# Patient Record
Sex: Female | Born: 1955 | Race: White | Hispanic: No | Marital: Married | Smoking: Never smoker
Health system: Southern US, Community
[De-identification: ages and names within clinical notes are randomized; demographics above are authoritative.]

## PROBLEM LIST (undated history)

## (undated) DIAGNOSIS — G35 Multiple sclerosis: Secondary | ICD-10-CM

## (undated) DIAGNOSIS — H539 Unspecified visual disturbance: Secondary | ICD-10-CM

## (undated) DIAGNOSIS — G629 Polyneuropathy, unspecified: Secondary | ICD-10-CM

## (undated) DIAGNOSIS — R51 Headache: Secondary | ICD-10-CM

## (undated) DIAGNOSIS — I10 Essential (primary) hypertension: Secondary | ICD-10-CM

## (undated) DIAGNOSIS — G259 Extrapyramidal and movement disorder, unspecified: Secondary | ICD-10-CM

## (undated) DIAGNOSIS — R519 Headache, unspecified: Secondary | ICD-10-CM

## (undated) HISTORY — DX: Headache: R51

## (undated) HISTORY — DX: Essential (primary) hypertension: I10

## (undated) HISTORY — DX: Extrapyramidal and movement disorder, unspecified: G25.9

## (undated) HISTORY — DX: Multiple sclerosis: G35

## (undated) HISTORY — DX: Headache, unspecified: R51.9

## (undated) HISTORY — DX: Unspecified visual disturbance: H53.9

## (undated) HISTORY — DX: Polyneuropathy, unspecified: G62.9

---

## 1998-05-31 ENCOUNTER — Other Ambulatory Visit: Admission: RE | Admit: 1998-05-31 | Discharge: 1998-05-31 | Payer: Self-pay | Admitting: *Deleted

## 1998-06-16 ENCOUNTER — Ambulatory Visit (HOSPITAL_COMMUNITY): Admission: RE | Admit: 1998-06-16 | Discharge: 1998-06-16 | Payer: Self-pay | Admitting: *Deleted

## 1998-07-21 ENCOUNTER — Ambulatory Visit (HOSPITAL_COMMUNITY): Admission: RE | Admit: 1998-07-21 | Discharge: 1998-07-21 | Payer: Self-pay | Admitting: Gastroenterology

## 1999-01-20 ENCOUNTER — Ambulatory Visit (HOSPITAL_COMMUNITY): Admission: RE | Admit: 1999-01-20 | Discharge: 1999-01-20 | Payer: Self-pay | Admitting: *Deleted

## 1999-06-28 ENCOUNTER — Other Ambulatory Visit: Admission: RE | Admit: 1999-06-28 | Discharge: 1999-06-28 | Payer: Self-pay | Admitting: *Deleted

## 1999-07-08 ENCOUNTER — Ambulatory Visit (HOSPITAL_COMMUNITY): Admission: RE | Admit: 1999-07-08 | Discharge: 1999-07-08 | Payer: Self-pay | Admitting: *Deleted

## 2002-02-25 ENCOUNTER — Other Ambulatory Visit: Admission: RE | Admit: 2002-02-25 | Discharge: 2002-02-25 | Payer: Self-pay | Admitting: Obstetrics and Gynecology

## 2002-03-06 ENCOUNTER — Ambulatory Visit (HOSPITAL_COMMUNITY): Admission: RE | Admit: 2002-03-06 | Discharge: 2002-03-06 | Payer: Self-pay | Admitting: Obstetrics and Gynecology

## 2002-03-06 ENCOUNTER — Encounter: Payer: Self-pay | Admitting: Obstetrics and Gynecology

## 2002-03-15 ENCOUNTER — Encounter: Payer: Self-pay | Admitting: Neurology

## 2002-03-15 ENCOUNTER — Ambulatory Visit (HOSPITAL_COMMUNITY): Admission: RE | Admit: 2002-03-15 | Discharge: 2002-03-15 | Payer: Self-pay | Admitting: Neurology

## 2002-04-12 ENCOUNTER — Encounter: Payer: Self-pay | Admitting: Emergency Medicine

## 2002-04-12 ENCOUNTER — Emergency Department (HOSPITAL_COMMUNITY): Admission: EM | Admit: 2002-04-12 | Discharge: 2002-04-12 | Payer: Self-pay | Admitting: Emergency Medicine

## 2004-03-24 ENCOUNTER — Other Ambulatory Visit: Admission: RE | Admit: 2004-03-24 | Discharge: 2004-03-24 | Payer: Self-pay | Admitting: Family Medicine

## 2004-04-21 ENCOUNTER — Ambulatory Visit (HOSPITAL_COMMUNITY): Admission: RE | Admit: 2004-04-21 | Discharge: 2004-04-21 | Payer: Self-pay | Admitting: Family Medicine

## 2006-04-19 ENCOUNTER — Other Ambulatory Visit: Admission: RE | Admit: 2006-04-19 | Discharge: 2006-04-19 | Payer: Self-pay | Admitting: Family Medicine

## 2006-05-03 ENCOUNTER — Ambulatory Visit (HOSPITAL_COMMUNITY): Admission: RE | Admit: 2006-05-03 | Discharge: 2006-05-03 | Payer: Self-pay | Admitting: Family Medicine

## 2007-05-05 ENCOUNTER — Other Ambulatory Visit: Admission: RE | Admit: 2007-05-05 | Discharge: 2007-05-05 | Payer: Self-pay | Admitting: Family Medicine

## 2007-05-23 ENCOUNTER — Ambulatory Visit (HOSPITAL_COMMUNITY): Admission: RE | Admit: 2007-05-23 | Discharge: 2007-05-23 | Payer: Self-pay | Admitting: Family Medicine

## 2008-05-14 ENCOUNTER — Other Ambulatory Visit: Admission: RE | Admit: 2008-05-14 | Discharge: 2008-05-14 | Payer: Self-pay | Admitting: Family Medicine

## 2008-06-04 ENCOUNTER — Ambulatory Visit (HOSPITAL_COMMUNITY): Admission: RE | Admit: 2008-06-04 | Discharge: 2008-06-04 | Payer: Self-pay | Admitting: Family Medicine

## 2008-08-20 ENCOUNTER — Encounter: Admission: RE | Admit: 2008-08-20 | Discharge: 2008-10-20 | Payer: Self-pay | Admitting: Neurology

## 2009-12-16 ENCOUNTER — Ambulatory Visit (HOSPITAL_COMMUNITY): Admission: RE | Admit: 2009-12-16 | Discharge: 2009-12-16 | Payer: Self-pay | Admitting: Family Medicine

## 2010-09-22 ENCOUNTER — Other Ambulatory Visit: Admission: RE | Admit: 2010-09-22 | Discharge: 2010-09-22 | Payer: Self-pay | Admitting: *Deleted

## 2010-12-22 ENCOUNTER — Ambulatory Visit (HOSPITAL_COMMUNITY)
Admission: RE | Admit: 2010-12-22 | Discharge: 2010-12-22 | Payer: Self-pay | Source: Home / Self Care | Attending: Family Medicine | Admitting: Family Medicine

## 2012-02-20 ENCOUNTER — Other Ambulatory Visit (HOSPITAL_COMMUNITY): Payer: Self-pay | Admitting: Family Medicine

## 2012-02-20 DIAGNOSIS — Z1231 Encounter for screening mammogram for malignant neoplasm of breast: Secondary | ICD-10-CM

## 2012-02-22 ENCOUNTER — Telehealth: Payer: Self-pay

## 2012-02-22 NOTE — Telephone Encounter (Signed)
Pt will be out of meds today it was faxed over

## 2012-02-23 NOTE — Telephone Encounter (Signed)
LMOM to CB. I could not find a record of any OVs here since Epic, and couldn't find pt in Med Man either by name or DOB

## 2012-02-24 NOTE — Telephone Encounter (Signed)
LMOM TO CB 

## 2012-02-25 NOTE — Telephone Encounter (Signed)
PT CALLED AND STATED THAT SHE HAS NOT CALLED [place] AT ALL REGARDING MEDICATIONS, SO IM GUESSING THE MESSAGE WAS PUT IN THE WRONG CHART.

## 2012-02-25 NOTE — Telephone Encounter (Signed)
LMOM TO CB 

## 2012-03-14 ENCOUNTER — Ambulatory Visit (HOSPITAL_COMMUNITY)
Admission: RE | Admit: 2012-03-14 | Discharge: 2012-03-14 | Disposition: A | Discharge status: Home / Self Care | Payer: BC Managed Care – PPO | Source: Ambulatory Visit | Attending: Family Medicine | Admitting: Family Medicine

## 2012-03-14 DIAGNOSIS — Z1231 Encounter for screening mammogram for malignant neoplasm of breast: Secondary | ICD-10-CM | POA: Insufficient documentation

## 2013-11-13 ENCOUNTER — Other Ambulatory Visit (HOSPITAL_COMMUNITY)
Admission: RE | Admit: 2013-11-13 | Discharge: 2013-11-13 | Disposition: A | Discharge status: Home / Self Care | Payer: BC Managed Care – PPO | Source: Ambulatory Visit | Attending: Family Medicine | Admitting: Family Medicine

## 2013-11-13 ENCOUNTER — Other Ambulatory Visit: Payer: Self-pay | Admitting: Family Medicine

## 2013-11-13 DIAGNOSIS — Z0000 Encounter for general adult medical examination without abnormal findings: Secondary | ICD-10-CM | POA: Insufficient documentation

## 2015-03-04 ENCOUNTER — Telehealth: Payer: Self-pay | Admitting: *Deleted

## 2015-03-04 ENCOUNTER — Other Ambulatory Visit: Payer: Self-pay | Admitting: Neurology

## 2015-03-04 NOTE — Telephone Encounter (Signed)
Patient has not been seen at [facility] 

## 2015-03-04 NOTE — Telephone Encounter (Signed)
Patient calling for medication refill, informed patient that she would have to be seen in the office before medicine can be refilled, patient was scheduled for 4/1, states that she will call back if unable to keep appointment

## 2015-03-04 NOTE — Telephone Encounter (Signed)
Noted/fim 

## 2015-03-05 ENCOUNTER — Encounter: Payer: Self-pay | Admitting: Neurology

## 2015-03-05 ENCOUNTER — Ambulatory Visit (INDEPENDENT_AMBULATORY_CARE_PROVIDER_SITE_OTHER): Payer: BC Managed Care – PPO | Admitting: Neurology

## 2015-03-05 VITALS — BP 130/82 | HR 64 | Resp 14 | Ht 65.0 in | Wt 156.0 lb

## 2015-03-05 DIAGNOSIS — R208 Other disturbances of skin sensation: Secondary | ICD-10-CM | POA: Diagnosis not present

## 2015-03-05 DIAGNOSIS — F329 Major depressive disorder, single episode, unspecified: Secondary | ICD-10-CM | POA: Insufficient documentation

## 2015-03-05 DIAGNOSIS — R350 Frequency of micturition: Secondary | ICD-10-CM | POA: Diagnosis not present

## 2015-03-05 DIAGNOSIS — R269 Unspecified abnormalities of gait and mobility: Secondary | ICD-10-CM | POA: Insufficient documentation

## 2015-03-05 DIAGNOSIS — R5383 Other fatigue: Secondary | ICD-10-CM

## 2015-03-05 DIAGNOSIS — G35 Multiple sclerosis: Secondary | ICD-10-CM | POA: Diagnosis not present

## 2015-03-05 DIAGNOSIS — F32A Depression, unspecified: Secondary | ICD-10-CM | POA: Insufficient documentation

## 2015-03-05 DIAGNOSIS — Z79899 Other long term (current) drug therapy: Secondary | ICD-10-CM | POA: Insufficient documentation

## 2015-03-05 MED ORDER — AMPYRA 10 MG PO TB12: 10.0000 mg | ORAL_TABLET | Freq: Two times a day (BID) | ORAL | Status: DC

## 2015-03-05 MED ORDER — GILENYA 0.5 MG PO CAPS: ORAL_CAPSULE | ORAL | Status: DC

## 2015-03-05 MED ORDER — SOLIFENACIN SUCCINATE 10 MG PO TABS: 10.0000 mg | ORAL_TABLET | Freq: Every day | ORAL | Status: DC

## 2015-03-05 MED ORDER — BACLOFEN 10 MG PO TABS: 10.0000 mg | ORAL_TABLET | Freq: Three times a day (TID) | ORAL | Status: DC

## 2015-03-05 MED ORDER — GABAPENTIN 300 MG PO CAPS: ORAL_CAPSULE | ORAL | Status: DC

## 2015-03-05 NOTE — Progress Notes (Signed)
[facility]  PATIENT: Denise Bridges DOB: 08-15-1956  REFERRING DOCTOR OR PCP:  Cammie Fulp SOURCE: Patient  _________________________________   HISTORICAL  CHIEF COMPLAINT:  Chief Complaint  Patient presents with  . Multiple Sclerosis    Sts. she tolerates Gilenya well--is taking it qod due to abnormal labs.  She needs r/f of Gabapentin./fim    HISTORY OF PRESENT ILLNESS:  Denise Bridges is a 59 year old woman with multiple sclerosis.    She presented with facial weakness in the early 1990s. An MRI was consistent with MS. A lumbar puncture was not needed. She was started, initially, on Avonex. About 4 years ago, she switch to Gilenya. She tolerates Gilenya well and has not had any exacerbation. However, she has had progressive worsening of her gait over the past 10 years or so.  Gait/strength/sensation: She has had difficulty with her gait. The left leg is weaker than the right. There is clumsiness as well.  She denies any significant numbness in the legs. However, she does have numbness and tingling in the left arm. She has been on Ampyra for a couple years with some benefit of her gait. Gabapentin helps the tingling some.  Bladder: She has urinary frequency and urgency. She has had some incontinence, often on her way to the bathroom. She has nocturia twice most nights. As not noted much of difference on Vesicare.  Vision: Prior to her diagnosis of MS, she had an episode of diplopia that eventually improved. Currently, she denies any MS related visual problems. She sees her eye doctor once a year.  Fatigue/sleep: She does note physical more than cognitive fatigue. This is often worse later in the day. She has more difficulty walking longer distances because of fatigue. She has difficulty falling asleep. Once asleep she will usually sleep well. However, nocturia can wake her up several times a night.  She notes that restless leg syndrome. Gabapentin and baclofen have  just helped a little bit.  This keeps her up some nights.  Mood/cognition: She is on venlafaxine which has helped her mild mood issues. She does note cognitive fog and has made more errors at work. She notes specifically that she has difficulty coming up with the right words and that she often transposes numbers.  There is some apathy. Processing speed is also slower. She notes some difficulty with short-term memory as well.  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  REVIEW OF SYSTEMS: Constitutional: No fevers, chills, sweats, or change in appetite.  She notes fatigue Eyes: No visual changes, double vision, eye pain Ear, nose and throat: No hearing loss, ear pain, nasal congestion, sore throat Cardiovascular: No chest pain, palpitations Respiratory: No shortness of breath at rest or with exertion.   No wheezes GastrointestinaI: No nausea, vomiting, diarrhea, abdominal pain, fecal incontinence Genitourinary: As above. Musculoskeletal: No neck pain, back pain Integumentary: No rash, pruritus, skin lesions Neurological: as above Psychiatric: No depression at this time.  No anxiety Endocrine: No palpitations, diaphoresis, change in appetite, change in weigh or increased thirst Hematologic/Lymphatic: No anemia, purpura, petechiae. Allergic/Immunologic: No itchy/runny eyes, nasal congestion, recent allergic reactions, rashes  ALLERGIES: Allergies  Allergen Reactions  . Losartan Cough    HOME MEDICATIONS:  Current outpatient prescriptions:  .  amLODipine (NORVASC) 5 MG tablet, , Disp: , Rfl:  .  AMPYRA 10 MG TB12, Take 10 mg by mouth 2 (two) times daily., Disp: , Rfl:  .  atenolol (TENORMIN) 100 MG tablet,  Take 100 mg by mouth daily., Disp: , Rfl:  .  cholecalciferol (VITAMIN D) 1000 UNITS tablet, Take 1,000 Units by mouth daily., Disp: , Rfl:  .  gabapentin (NEURONTIN) 300 MG capsule, Take 300 mg by mouth 3 (three) times daily., Disp: , Rfl:  .  GILENYA 0.5 MG CAPS,  Take 0.5 mg by mouth every other day., Disp: , Rfl:   PAST MEDICAL HISTORY: Past Medical History  Diagnosis Date  . Multiple sclerosis   . Movement disorder   . Hypertension   . Headache   . Neuropathy   . Vision abnormalities     PAST SURGICAL HISTORY: History reviewed. No pertinent past surgical history.  FAMILY HISTORY: Family History  Problem Relation Age of Onset  . Pancreatic cancer Mother   . Diabetes Mother   . Prostate cancer Father   . Dementia Father   . Multiple sclerosis Cousin     SOCIAL HISTORY:  History   Social History  . Marital Status: Married    Spouse Name: N/A  . Number of Children: N/A  . Years of Education: N/A   Occupational History  . Not on file.   Social History Main Topics  . Smoking status: Never Smoker   . Smokeless tobacco: Not on file  . Alcohol Use: No  . Drug Use: No  . Sexual Activity: Not on file   Other Topics Concern  . Not on file   Social History Narrative  . No narrative on file     PHYSICAL EXAM  Filed Vitals:   03/05/15 0846  BP: 130/82  Pulse: 64  Resp: 14  Height: 5\' 5"  (1.651 m)  Weight: 156 lb (70.761 kg)    Body mass index is 25.96 kg/(m^2).   General: The patient is well-developed and well-nourished and in no acute distress  Eyes:  Funduscopic exam shows normal optic discs and retinal vessels.  Neck: The neck is supple, no carotid bruits are noted.  The neck is nontender.  Cardiovascular: The heart has a regular rate and rhythm with a normal S1 and S2. There were no murmurs, gallops or rubs. Lungs are clear to auscultation.  Skin: Extremities are without significant edema.  Musculoskeletal:  Back is nontender  Neurologic Exam  Mental status: The patient is alert and oriented x 3 at the time of the examination. The patient has apparent normal recent and remote memory, with an apparently normal attention span and concentration ability.   Speech is normal.  Cranial nerves: Extraocular  movements are full. Pupils are equal, round, and reactive to light and accomodation.  Visual fields are full.  Facial symmetry is present. There is good facial sensation to soft touch bilaterally.Facial strength is normal.  Trapezius and sternocleidomastoid strength is normal. No dysarthria is noted.  The tongue is midline, and the patient has symmetric elevation of the soft palate. No obvious hearing deficits are noted.  Motor:  Muscle bulk is normal.   Tone is increased in both legs, more the left. Strength is  5 / 5 in all 4 extremities.   Sensory: Sensory testing shows slight decreased touch sensation in left arm and leg..  Coordination: Cerebellar testing reveals good finger-nose-finger and reduced heel-to-shin, worse on left.  Gait and station: Station is normal.   Gait is mildly wide and spastic, especially with the left leg. Her turn was off balance. She cannot do a good tandem walk.. Romberg is negative.   Reflexes: Deep tendon reflexes are increased in legs, left  more than right. There was nonsustained clonus at the left ankle and spread from the left knee. Reflexes in the arms were more symmetric.   Plantar responses are extensor on the left.    DIAGNOSTIC DATA (LABS, IMAGING, TESTING) - I reviewed patient records, labs, notes, testing and imaging myself where available.    ASSESSMENT AND PLAN  Multiple sclerosis - Plan: CBC with Differential  Gait disorder  Dysesthesia  High risk medication use - Plan: CBC with Differential  Urinary frequency  Other fatigue  Depression   In summary, Shailey Butterbaugh is a 59 year old woman with multiple sclerosis who is fairly stable on Gilenya therapy. Although she has not had any exacerbations she has had some progression of gait and cognitive disturbances. She will continue the Ampyra for her gait.   I will try to get her last MRI done late 2015 to make sure that there are not any concerns about subclinical progression. Part of her  cognitive issues could be worsening sleep have made some changes in education, including increasing her gabapentin to try to help.  Bladder dysfunction is often a problem and we will double her Vesicare to 10 mg.  She is going to also retry baclofen to see if it will help her spastic gait she'll start at 10 mg by mouth 3 times a day. We discussed that if she is sleeping of the baclofen that she might benefit from entering a drug study with there is a time release baclofen.  She will return to see me in 4 months or sooner if she has new or worsening neurologic symptoms.  45 minute face-to-face evaluation with greater than 30% of the time counseling and coordinating care about her multiple sclerosis and related symptoms. bacl (trial?) Inc gab  Anthonella Klausner A. Epimenio Foot, MD, PhD 03/05/2015, 9:06 AM Certified in Neurology, Clinical Neurophysiology, Sleep Medicine, Pain Medicine and Neuroimaging  [facility] [address] [place], [place] [address] [phone]

## 2015-03-06 LAB — CBC WITH DIFFERENTIAL/PLATELET
BASOS ABS: 0 10*3/uL (ref 0.0–0.2)
Basos: 0 %
Eos: 1 %
Eosinophils Absolute: 0 10*3/uL (ref 0.0–0.4)
HCT: 36.6 % (ref 34.0–46.6)
Hemoglobin: 11.9 g/dL (ref 11.1–15.9)
IMMATURE GRANULOCYTES: 0 %
Immature Grans (Abs): 0 10*3/uL (ref 0.0–0.1)
LYMPHS ABS: 0.4 10*3/uL — AB (ref 0.7–3.1)
Lymphs: 10 %
MCH: 30.3 pg (ref 26.6–33.0)
MCHC: 32.5 g/dL (ref 31.5–35.7)
MCV: 93 fL (ref 79–97)
MONOCYTES: 17 %
MONOS ABS: 0.6 10*3/uL (ref 0.1–0.9)
NEUTROS ABS: 2.7 10*3/uL (ref 1.4–7.0)
NEUTROS PCT: 72 %
Platelets: 200 10*3/uL (ref 150–379)
RBC: 3.93 x10E6/uL (ref 3.77–5.28)
RDW: 13.9 % (ref 12.3–15.4)
WBC: 3.7 10*3/uL (ref 3.4–10.8)

## 2015-03-08 ENCOUNTER — Telehealth: Payer: Self-pay | Admitting: *Deleted

## 2015-03-08 NOTE — Telephone Encounter (Signed)
LMTC./fim 

## 2015-03-08 NOTE — Telephone Encounter (Signed)
-----   Message from Richard A Sater, MD sent at 03/06/2015  8:57 AM EDT ----- Blood count is ok.   (low lymphs expected on Gilenya) 

## 2015-03-12 NOTE — Telephone Encounter (Signed)
-----   Message from Asa Lente, MD sent at 03/06/2015  8:57 AM EDT ----- Blood count is ok.   (low lymphs expected on Gilenya)

## 2015-03-12 NOTE — Telephone Encounter (Signed)
LMOM that per RAS labwork is ok--no med changes needed.  She only needs to return this call if she has questions/concerns/fim

## 2015-03-25 ENCOUNTER — Other Ambulatory Visit (HOSPITAL_COMMUNITY): Payer: Self-pay | Admitting: Family Medicine

## 2015-03-25 DIAGNOSIS — Z1231 Encounter for screening mammogram for malignant neoplasm of breast: Secondary | ICD-10-CM

## 2015-03-31 ENCOUNTER — Telehealth: Payer: Self-pay | Admitting: Neurology

## 2015-03-31 NOTE — Telephone Encounter (Signed)
Ins has been provided with clinical info, request is currently under review.  Ref Key: Sammuel Cooper.  I called back to advise.  They are aware.

## 2015-03-31 NOTE — Telephone Encounter (Signed)
Denise Bridges, pt's husband called wanting to see what needs to be done to approve the script for GILENYA 0.5 MG CAPS. He states that he received a letter from [facility] stating that Dr. Epimenio Foot needs to  Do a prior authorization for the script to be approved. Please call and advice # [phone]

## 2015-04-01 NOTE — Telephone Encounter (Signed)
[facility] has approved the request for coverage on Gilenya effective until 03/31/2016 Ref # [id number].  I called the patient back to advise.  They are aware.

## 2015-04-08 ENCOUNTER — Ambulatory Visit (HOSPITAL_COMMUNITY): Payer: BC Managed Care – PPO | Attending: Family Medicine

## 2015-07-08 ENCOUNTER — Encounter: Payer: Self-pay | Admitting: Neurology

## 2015-07-08 ENCOUNTER — Ambulatory Visit (INDEPENDENT_AMBULATORY_CARE_PROVIDER_SITE_OTHER): Payer: BC Managed Care – PPO | Admitting: Neurology

## 2015-07-08 VITALS — BP 132/80 | HR 54 | Ht 65.0 in | Wt 153.4 lb

## 2015-07-08 DIAGNOSIS — R208 Other disturbances of skin sensation: Secondary | ICD-10-CM

## 2015-07-08 DIAGNOSIS — R269 Unspecified abnormalities of gait and mobility: Secondary | ICD-10-CM | POA: Diagnosis not present

## 2015-07-08 DIAGNOSIS — F329 Major depressive disorder, single episode, unspecified: Secondary | ICD-10-CM

## 2015-07-08 DIAGNOSIS — R4189 Other symptoms and signs involving cognitive functions and awareness: Secondary | ICD-10-CM

## 2015-07-08 DIAGNOSIS — R5383 Other fatigue: Secondary | ICD-10-CM

## 2015-07-08 DIAGNOSIS — F32A Depression, unspecified: Secondary | ICD-10-CM

## 2015-07-08 DIAGNOSIS — Z79899 Other long term (current) drug therapy: Secondary | ICD-10-CM | POA: Diagnosis not present

## 2015-07-08 DIAGNOSIS — G35 Multiple sclerosis: Secondary | ICD-10-CM

## 2015-07-08 DIAGNOSIS — R350 Frequency of micturition: Secondary | ICD-10-CM | POA: Diagnosis not present

## 2015-07-08 NOTE — Progress Notes (Signed)
[facility]  PATIENT: Denise Bridges DOB: 07-Nov-1956  REFERRING DOCTOR OR PCP:  Cammie Fulp SOURCE: Patient  _________________________________   HISTORICAL  CHIEF COMPLAINT:  Chief Complaint  Patient presents with  . Follow-up    Doing okay with Gilenya. She is still taking ampyra for poor gait and it is helping. Bladder better since vesicare was increased. However, she feels it helps during the day but not at night. She has had 2 UTI's in the past couple months. Baclofen is helping spasticity, "When I take it".    HISTORY OF PRESENT ILLNESS:  Denise Bridges is a 59 year old woman with multiple sclerosis.    She tolerates Gilenya well and has not had any exacerbation. However, she has had progressive worsening of her gait over the past 10 years or so.  Gait/strength/sensation: She is having more difficulty with her gait. The left leg is weaker than the right.  Sometimes the left leg gives out and she has noted a foot drop that caused a fall a few times.    There is clumsiness as well.  She denies any significant numbness in the legs. However, she does have numbness and tingling in the left arm. She has been on Ampyra for a couple years with benefit .    She ran out once and did much worse for a few days.     Bladder: She has urinary frequency and urgency. She has had some incontinence, often on her way to the bathroom. She has nocturia twice most nights.   She has not noted hesitancy or failure to empty but has had 2 UTI's this year.     Vision: Currently, she denies any MS related visual problems. Prior to her diagnosis of MS, she had an episode of diplopia that eventually improved. She sees her eye doctor once a year.  Fatigue/sleep: She does note physical more than cognitive fatigue. This is often worse later in the day.   She is always tired by the time she finishes working.    She has more difficulty walking longer distances because of fatigue. She has insomnia.     She has difficulty falling asleep and wakes up a couple times most nights. Nocturia can wake her up several times a night.  She notes that restless leg syndrome. Gabapentin and baclofen have just helped a little bit.  This keeps her up some nights.  Mood/cognition: She is on venlafaxine which has helped her mild depression. She makes many errors at work which is often frustrating to her.   She notes specifically that she has difficulty coming up with the right words and that she often transposes numbers.  There is some apathy. Processing speed is also slower. She notes some difficulty with short-term memory as well.   MS History:     She presented with facial weakness in the early 1990s. An MRI was consistent with MS. She was started, initially, on Avonex. About 4 years ago, she switch to Gilenya. She tolerates Gilenya well and has not had any exacerbation since before 2012. However, she has had progressive worsening of her gait over the past 10 years or so.       REVIEW OF SYSTEMS: Constitutional: No fevers, chills, sweats, or change in appetite.  She notes fatigue Eyes: No visual changes, double vision, eye pain Ear, nose and throat: No hearing loss, ear pain, nasal congestion, sore throat Cardiovascular: No chest pain, palpitations Respiratory: No shortness of breath at rest or  with exertion.   No wheezes GastrointestinaI: No nausea, vomiting, diarrhea, abdominal pain, fecal incontinence Genitourinary: As above. Musculoskeletal: No neck pain, back pain Integumentary: No rash, pruritus, skin lesions Neurological: as above Psychiatric: No depression at this time.  No anxiety Endocrine: No palpitations, diaphoresis, change in appetite, change in weigh or increased thirst Hematologic/Lymphatic: No anemia, purpura, petechiae. Allergic/Immunologic: No itchy/runny eyes, nasal congestion, recent allergic reactions, rashes  ALLERGIES: Allergies  Allergen Reactions  . Losartan Cough    HOME  MEDICATIONS:  Current outpatient prescriptions:  .  amLODipine (NORVASC) 5 MG tablet, , Disp: , Rfl:  .  AMPYRA 10 MG TB12, Take 1 tablet (10 mg total) by mouth 2 (two) times daily., Disp: 180 tablet, Rfl: 3 .  atenolol (TENORMIN) 100 MG tablet, Take 100 mg by mouth daily., Disp: , Rfl:  .  baclofen (LIORESAL) 10 MG tablet, Take 1 tablet (10 mg total) by mouth 3 (three) times daily. (Patient taking differently: Take 10 mg by mouth 3 (three) times daily as needed. ), Disp: 90 each, Rfl: 5 .  cholecalciferol (VITAMIN D) 1000 UNITS tablet, Take 1,000 Units by mouth daily., Disp: , Rfl:  .  ciprofloxacin (CIPRO) 250 MG tablet, take 1 tablet by mouth twice a day for 5 days, Disp: , Rfl: 0 .  gabapentin (NEURONTIN) 300 MG capsule, One po in am and two po at night, Disp: 90 capsule, Rfl: 3 .  GILENYA 0.5 MG CAPS, One pill every other day, Disp: 45 capsule, Rfl: 3 .  solifenacin (VESICARE) 10 MG tablet, Take 1 tablet (10 mg total) by mouth daily., Disp: 90 tablet, Rfl: 3 .  venlafaxine (EFFEXOR) 75 MG tablet, Take 75 mg by mouth 2 (two) times daily., Disp: , Rfl:   PAST MEDICAL HISTORY: Past Medical History  Diagnosis Date  . Multiple sclerosis   . Movement disorder   . Hypertension   . Headache   . Neuropathy   . Vision abnormalities     PAST SURGICAL HISTORY: History reviewed. No pertinent past surgical history.  FAMILY HISTORY: Family History  Problem Relation Age of Onset  . Pancreatic cancer Mother   . Diabetes Mother   . Prostate cancer Father   . Dementia Father   . Multiple sclerosis Cousin     SOCIAL HISTORY:  History   Social History  . Marital Status: Married    Spouse Name: N/A  . Number of Children: N/A  . Years of Education: N/A   Occupational History  . Not on file.   Social History Main Topics  . Smoking status: Never Smoker   . Smokeless tobacco: Not on file  . Alcohol Use: No  . Drug Use: No  . Sexual Activity: Not on file   Other Topics Concern  .  Not on file   Social History Narrative     PHYSICAL EXAM  Filed Vitals:   07/08/15 1453  BP: 132/80  Pulse: 54  Height: 5\' 5"  (1.651 m)  Weight: 153 lb 6.4 oz (69.582 kg)    Body mass index is 25.53 kg/(m^2).   General: The patient is well-developed and well-nourished and in no acute distress  Skin: Extremities are without significant edema.  Musculoskeletal:  Back is nontender  Neurologic Exam  Mental status: The patient is alert and oriented x 3 at the time of the examination. The patient has apparent normal recent and remote memory, with a reduced attention span and concentration ability.   Speech is normal.  Cranial nerves: Extraocular  movements are full.  Facial symmetry is present. There is good facial sensation to soft touch bilaterally.Facial strength is normal.  Trapezius and sternocleidomastoid strength is normal. No dysarthria is noted.    No obvious hearing deficits are noted.  Motor:  Muscle bulk is normal.   Tone is increased in both legs, more on the left. Strength is  5 / 5 in all 4 extremities.   Sensory: Sensory testing shows decreased touch and vibration sensation in left arm and leg..  Coordination: Cerebellar testing reveals good right and reduced left finger-nose-finger and reduced heel-to-shin, worse on left.  Gait and station: Station is normal.   Gait is mildly wide and spastic, especially with the left leg. Her turn was off balance. She cannot do a good tandem walk..    Reflexes: Deep tendon reflexes are increased in legs, left more than right. There was nonsustained clonus at the left ankle and spread from the left knee. Reflexes in the arms were more symmetric.   Plantar responses are extensor on the left.    DIAGNOSTIC DATA (LABS, IMAGING, TESTING) - I reviewed patient records, labs, notes, testing and imaging myself where available.    ASSESSMENT AND PLAN  Multiple sclerosis  Gait disorder  Dysesthesia  High risk medication  use  Urinary frequency  Other fatigue  Depression  1.  Due to a combination of cognitive impairment, fatigue and physical decline, she is unable to continue working and will consider disability application. 2.   I will check neurocognitive testing to better determine extent of cognitive impairment.   3.   rtc 4 months or sooner if problems.    Sher Shampine A. Epimenio Foot, MD, PhD 07/08/2015, 3:19 PM Certified in Neurology, Clinical Neurophysiology, Sleep Medicine, Pain Medicine and Neuroimaging  [facility] [address], [address] [place], [place] [address] [phone]

## 2015-07-09 ENCOUNTER — Telehealth: Payer: Self-pay

## 2015-07-09 LAB — CBC WITH DIFFERENTIAL/PLATELET
Basophils Absolute: 0 x10E3/uL (ref 0.0–0.2)
Basos: 0 %
EOS (ABSOLUTE): 0.1 x10E3/uL (ref 0.0–0.4)
Eos: 2 %
Hematocrit: 38.3 % (ref 34.0–46.6)
Hemoglobin: 12.3 g/dL (ref 11.1–15.9)
Immature Grans (Abs): 0 x10E3/uL (ref 0.0–0.1)
Immature Granulocytes: 0 %
Lymphocytes Absolute: 0.3 x10E3/uL — ABNORMAL LOW (ref 0.7–3.1)
Lymphs: 11 %
MCH: 29.9 pg (ref 26.6–33.0)
MCHC: 32.1 g/dL (ref 31.5–35.7)
MCV: 93 fL (ref 79–97)
Monocytes Absolute: 0.5 x10E3/uL (ref 0.1–0.9)
Monocytes: 22 %
Neutrophils Absolute: 1.6 x10E3/uL (ref 1.4–7.0)
Neutrophils: 65 %
Platelets: 207 x10E3/uL (ref 150–379)
RBC: 4.11 x10E6/uL (ref 3.77–5.28)
RDW: 13.6 % (ref 12.3–15.4)
WBC: 2.4 x10E3/uL — CL (ref 3.4–10.8)

## 2015-07-09 NOTE — Telephone Encounter (Signed)
Left message on vm of results and recommendation below.

## 2015-07-09 NOTE — Telephone Encounter (Signed)
-----   Message from Asa Lente, MD sent at 07/09/2015  8:19 AM EDT ----- Please let her know the lymphocyte count an white blood cell count was low but we see that frequently with Gilenya.    she can continue on the current dose.

## 2015-08-18 ENCOUNTER — Telehealth: Payer: Self-pay | Admitting: Neurology

## 2015-08-18 NOTE — Telephone Encounter (Signed)
Patient called regarding AMPYRA 10 MG TB12. Pharmacy advised patient to call our office. Medication needs prior auth.

## 2015-08-18 NOTE — Telephone Encounter (Signed)
Ins has been contacted and provided with clinical info.  Request is currently under review.  Ref # [id number]

## 2015-08-18 NOTE — Telephone Encounter (Signed)
Ins has approved the request for coverage on Ampyra effective until 08/16/2016 Ref # [id number]

## 2015-09-27 ENCOUNTER — Ambulatory Visit: Payer: BC Managed Care – PPO | Attending: Psychology | Admitting: Psychology

## 2015-09-27 DIAGNOSIS — G35 Multiple sclerosis: Secondary | ICD-10-CM | POA: Insufficient documentation

## 2015-09-27 DIAGNOSIS — F09 Unspecified mental disorder due to known physiological condition: Secondary | ICD-10-CM | POA: Insufficient documentation

## 2015-09-27 DIAGNOSIS — F329 Major depressive disorder, single episode, unspecified: Secondary | ICD-10-CM | POA: Insufficient documentation

## 2015-09-27 DIAGNOSIS — F32A Depression, unspecified: Secondary | ICD-10-CM

## 2015-09-27 NOTE — Progress Notes (Signed)
[facility]  [address]   Telephone [phone] [address] Fax [phone] [place], [place] [address]  Initial Contact Note  Name:  Denise Bridges Date of Birth; [birth year] MRN:  [id number] Date:  09/27/2015  Denise Bridges is an 59 y.o. female with a history of Multiple Sclerosis who was referred for neuropsychological evaluation by Denise Arias, MD of [facility] to obtain an assessment of her cognitive functioning.   A total of 5 hours was spent today reviewing medical records, interviewing [phone]) Denise Bridges and administering and scoring neurocognitive tests (CPT [phone] & [phone]).  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   A full report will follow once the planned testing has been completed. Her next appointment is TBD.    Denise Bridges, Ph.D Licensed Psychologist 09/27/2015

## 2015-10-06 ENCOUNTER — Ambulatory Visit: Payer: BC Managed Care – PPO | Attending: Psychology | Admitting: Psychology

## 2015-10-06 DIAGNOSIS — F09 Unspecified mental disorder due to known physiological condition: Secondary | ICD-10-CM | POA: Insufficient documentation

## 2015-10-06 DIAGNOSIS — G35 Multiple sclerosis: Secondary | ICD-10-CM | POA: Diagnosis not present

## 2015-10-06 DIAGNOSIS — F0631 Mood disorder due to known physiological condition with depressive features: Secondary | ICD-10-CM | POA: Insufficient documentation

## 2015-10-06 NOTE — Progress Notes (Addendum)
[facility]  [address]   Telephone [phone] [address] Fax [phone] [place], [place] [address]   NEUROPSYCHOLOGICAL EVALUATION  *CONFIDENTIAL* This report should not be released without the consent of the client  Name:   Denise Bridges. Capes  Date of Birth:  14-Dec-2055 [facility] MR#:  [id number] Dates of Evaluation: 09/27/15 & 10/06/15  Reason for Referral Denise Bridges is a 59 year-old, right-handed woman with a history of Multiple Sclerosis (MS) since 1993 who was referred for neuropsychological evaluation by Despina Arias, MD of [facility] for an assessment of her cognitive functioning.   Sources of Information Electronic medical records from the [facility] were reviewed. Ms. Plunk and her husband were interviewed.    Chief Complaints Denise Bridges reported that over the past two years she has experienced increasing cognitive difficulties, primarily in the areas of speed of processing, short-term memory and word finding. 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 Co-workers have told her that her ability to find words while speaking has declined. She reported having struggled to maintain an acceptable pace at work. She noted that her physical stamina has waned to the point that she needs to take frequent rest breaks throughout the work day. She also reported that within the past five years her gait and left-sided sensation have declined. Her other current MS-related symptoms include intermittent left leg spams, weakness in both legs (especially the left), clumsiness, urinary urgency with occasional incontinence and fatigue that seems to peak by late afternoon. She reported having had difficulty falling asleep for several years with restless leg syndrome sometimes disrupting her sleep. She  denied any MS-related visual problems. With regards to her mood, she reported intensified feelings of uselessness and frustration in reaction to her worsening physical and cognitive problems. She reported that at times she has wanted "to cease to exist" but denied having had thoughts of suicide. She denied experiencing persisting sad mood, apathy, loss of interests, mood instability, undue anxiety, problems with impulse control, aggressive behavior, homicidal thoughts, hallucinations or delusions.  Her husband reported that her fatigue and cognitive difficulties have worsened within the past year. He has observed her to seem to forget what she was doing in the midst of a task or what she was trying to say. He has noticed her having more difficulty learning new procedures or routines as well as a slowing of her decision-making speed. He has taken over doing the housework, cooking and bill paying. He has not observed any changes in her mood, personality or habits.   [name] decided to take early retirement in October 2016 due to concerns about her fatigue and cognitive functioning. She has recently applied for disability benefits.  Background Denise Bridges reported that she was diagnosed with MS in 1993. She has not had an MS exacerbation for many years. In addition to MS, her past medical history was notable for hypertension.   She denied history of head injury, loss of consciousness, seizure activity, neurological infection or exposure to neurotoxic substances. She reported no use of alcohol, illicit drugs or tobacco products, which is in accordance with her [employer] faith.  With regards to her mental health history, she reported often feeling depressed in reaction to her situation but never to the degree of interfering with her functioning at home or work. She has taken  antidepressant medications off and on over the past twenty-five years with some benefit. She saw a psychological counselor about twenty  years ago but did not find that to be helpful.   Her current medications include atenolol for hypertension, Gilenya for MS, Ampyra for gait, gabapentin for sleep and pain, solifenacin for bladder dysfunction, baclofen for spastic gait and venlafaxine for mood.  She lives with her husband of forty years. They have four children and eleven grandchildren.   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  She reported that she earned a Scientist, research (physical sciences) from [name]. She reported no history of school-based attentional or learning problems.   Observations She appeared as an appropriately dressed and groomed woman in no apparent distress. She walked with a limp while using a cane. She interacted in a consistently pleasant and cooperative manner. She spoke in a normal tone of voice, maintained good eye contact and responded to all questions. Her speech was hesitant at times though clearly articulated. Her affect appeared to be constricted in range and congruent with her thoughts. She did not display overt signs of emotional distress. Her thought processes were coherent and organized without loose associations, verbal perseverations or flight of ideas. Her thought content was devoid of unusual or bizarre ideas.  Evaluation Procedures In addition to review of medical records and interviews, the following tests or questionnaires were administered:  Animal Naming Test [facility] Depression Inventory-FastScreen  [facility] Naming Test Controlled Oral Word Association Test Finger Tapping Test Grooved Pegboard Test Multidimensional Health Profile-Psychosocial Functioning  Rey 15-Item Memory Test Rey Complex Figure: copy Stroop Color and Word Test Trail Making A & B Wechsler Adult Intelligence Scale-IV:   Clinical cytogeneticist, Coding, Digit Span, Matrix Reasoning & Symbol Search Wechsler Memory Scale- IV  Wide Range Achievement Test-4: word reading [employer] Results Interpretive Considerations She was cooperative throughout the testing process. She did not report or display problems with vision (she wore her eyeglasses), hearing or motor control. She had no apparent problems understanding task instructions. She appeared to maintain alertness and persist to task. She appeared to expend good effort, which was supported by her performance on a symptom validity measure. On a test of effort that required her to immediately draw fifteen symbols from memory that can be easily accomplished due to the redundancy amongst the items (Rey 15-Item Memory Test), she drew all 15 items. In sum, the current test results are deemed to represent a valid measure of her cognitive functioning.  Her premorbid intellectual functioning was estimated to fall within the Average range on the basis of her educational/vocational background coupled with a measure of word reading skill (Wide Range Achievement Test-4: Word Reading). Word reading level is considered to be a good estimate of pre-morbid functioning as it is an over-learned verbal skill that is relatively resistant to the effects of neurological illness.   A listing of her test scores can be found at the end of this report.  Speed of Information Processing & Executive Function Her performances on tests of processing speed were mixed with several falling below expectations. Her score on a test that required speed and accuracy to discriminate similarities and differences amongst sets of geometric symbols (Wechsler Adult Intelligence Scale-IV (WAIS-IV) Symbol Search) fell at the boundary between the Borderline and Low Average range. Her speed on tests that required her to transcribe symbols to match numbers using a key (WAIS-IV Coding)  or draw lines to connect randomly arrayed numbers in numerical sequence (Trails A) fell within the Average range. Her speed to read words or name color hues (Stroop Color and Word test)  fell within the Low Average range.   She performed within the Average range on untimed tests of attentional capacity/working memory that required her to mentally rearrange digit sequences in reverse or ascending sequence (WAIS-IV Digit Span), immediately recognize symbols in left to right order or (Wechsler Memory Scale-IV (WMS-IV) Symbol Span) or immediately recall of spatial locations within a grid (WMS-IV Spatial Addition).   Her performances on timed tests that demanded cognitive flexibility were mixed and generally lower than expected. Her speed on a psychomotor test that required efficient visual scanning coupled with set shifting (Trails B) was within the Low Average range. Her speed to name the print color of a word while simultaneously ignoring the conflicting word (Stroop Color Word Test), which required selective attention and inhibition of an automatic response, was within the Average range. Her ability to generate words to designated letters (Controlled Oral Word Association Test) fell within the Low Average range. Her ability to name members of a category (Animal Naming Test) within an allotted period of time was within the Mildly Impaired range.   She performed within normal expectations on an untimed test of nonverbal problem-solving and conceptual flexibility that required inferring logical ways to sort geometric designs on the basis of ongoing feedback [employer]). She completed all six categories, made an average number of errors, consistently maintained set and effectively shifted to a new strategy in response to feedback that the previous strategy was no longer correct.   Learning & Memory She performed within normal expectations on a memory battery. Her immediate recall of verbal and visual information (WMS-IV Immediate Memory Index) fell within the Average range at the 34th percentile. A measure of her delayed memory (WMS-IV Delayed Memory Index) fell at the midpoint  of the Average range at the 50th percentile. Her Delayed Memory Index was better than expected given her Immediate Memory Index, which indicated that she benefitted from the extra time of the delay interval to more fully consolidate information into memory. There was no difference between measures of auditory and visual memory.  Language Her naming to confrontation [facility] [employer]) was within the High Average range. As noted above, measures of phonemic fluency (Controlled Oral Word Association Test) and especially semantic fluency (Animal Naming Test) were below expectations.  Her word reading skill (Wide Range Achievement Test-4: Word Reading) fell within the Average range.   Visuospatial Perception & Organization There were no signs of spatial inattention or problems with visual recognition. Her performance on a visuospatial organizational task that required assembly of two-dimensional block designs from models (WAIS-IV Block Design) fell at the upper boundary of the Average range. Her ability to perceive visual details within designs in order to form nonverbal concepts (WAIS-IV Matrix Reasoning) was a relative strength within the Very Superior range. Her drawing of a spatially complex geometric figure (Rey Complex Figure) was normal.   Fine Motor  She demonstrated consistent right hand preference. No problems with gross motor coordination were observed. Measures of fine motor speed (Finger Tapping Test) and eye-hand dexterity/coordination (Grooved Pegboard Test) fell within the Average range for her right hand but within the Mildly Impaired range for her left hand.  Emotional Functioning The Multidimensional Health Profile-Psychosocial Functioning is a self-report questionnaire that assesses perceived life stress, coping skills, social resources and psychological distress.  She cited having experienced a far greater than typical number of stressful events within the past year that she rated in  aggregate as having been moderately stressful. Her overall level of global psychological distress fell within the clinically elevated range. More specifically, she reported a very high degree of guilt and self-blame. Scales representing depressed mood, anxiety or somatic complaints were within the above average range. She reported having had mostly positive interactions with others. She endorsed having been satisfied with the amount of emotional, informational and tangible support she has received from family and close friends. She rated herself as feeling somewhat dissatisfied with her life overall.    On the Beck Depression Inventory-FastScreen, which is designed to screen for depression in patients with medical illness, her score of 5 was suggestive of mild depression. No item was endorsed beyond a 1 on a 0 - 3 scale. Of note, she did not endorse having had suicidal thoughts over the past two weeks.   Summary & Conclusions Mayreli Alden is a 59 year-old woman with a history of Multiple Sclerosis (MS) diagnosed in 1993 who reported an approximate two year history of increased problems with cognitive functioning and fatigue. She also reported declines in her gait and left-sided sensation over the past five years.   Her neuropsychological profile was notable for relative weaknesses or mild deficits on timed tests that required focused attention, set shifting, verbal productivity or left hand fine motor coordination. Otherwise, measures of learning, memory, visual-spatial organization, right-hand fine motor skills and conceptual reasoning were within normal expectations. Assessment of her psychological status indicated that she reported a mild level of chronic depression in context of a high degree of life stress related to her health as well as to family concerns.   In conclusion, the finding of diminished speed of information processing was consistent with the typical effects of MS on cognitive functioning.  Her slowed information processing speed likely underlies her report of difficulties maintaining pace at work, acquiring new information, executing tasks dependent on higher-level cognitive functions and making decisions efficiently.  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 She did not express interest in pursuing either of these services at this time.    I have appreciated the opportunity to evaluate Ms. Milne. Please feel free to contact me with any comments or questions.    ___________________ Gladstone Pih, Ph.D Licensed Psychologist    ADDENDUM-NEUROPSYCHOLOGICAL TEST RESULTS  Her test scores were corrected to reflect norms for her age and, whenever possible, her gender and educational level (i.e., 15 years).  Animal Naming Test Score= 14  6th (adjusted for age, gender and educational level)    [place] Naming Test Score=59/60 88th (adjusted for age, gender and educational level)    Controlled Oral Word Association Test Score=  35 words/1 repetition 21st (adjusted for age, gender and educational level)    Finger Tapping R: 38.7 25th (adjusted for age, gender and educational level)  L:  28   3rd  (adjusted for age, gender and educational level)    Grooved Pegboard R:  68s 50th  (adjusted for age, gender and educational level)  L: 113s  5th  (adjusted for age, gender and educational level)    Rey Complex Figure: copy       Score= 35/36  normal    Rey 15-Item Memory Test Score= 15/15 normal    Stroop Color Word Test  Score residual %  (adjusted for age and educational level)  Word 92 -14 16th   Color 63 -14 13th   Color-Word 40    0 50th      Trails A Score= 30s 45th (adjusted for age, gender and educational level)  Trails B Score= 91s  16th (adjusted for age, gender and educational level)    Wechsler Adult Intelligence Scale-IV   Subtest Scaled Score Percentile  Block Design  12 75th     Digit Span  Forward               Backward               Sequencing   8   8   8 10  25th        25th   25th     50th      Matrix Reasoning 17 99th     Symbol Search   6   9th   Coding     8 25th        Wechsler Memory Scale-IV  Index Index Score Percentile  Immediate Memory   94 34th      Auditory Memory   97 42nd       Visual Memory   98 45th    Delayed Memory 100 50th      Visual Working Memory   94 34th        [facility]  Total errors=  17 61st  (adjusted for age and education)  Perseverative errors=    8 61st     Categories=    6 >16th     Trials to first category=  11 >16th    Failure to maintain set   0 >16th   Learning to learn=   0 >16th     Wide Range Achievement Test-4 Subtest  Raw score Standard score Percentile  Word Reading 60/70  97 42nd

## 2015-11-02 DIAGNOSIS — Z0271 Encounter for disability determination: Secondary | ICD-10-CM

## 2015-11-11 ENCOUNTER — Ambulatory Visit (INDEPENDENT_AMBULATORY_CARE_PROVIDER_SITE_OTHER): Payer: BC Managed Care – PPO | Admitting: Neurology

## 2015-11-11 ENCOUNTER — Encounter: Payer: Self-pay | Admitting: Neurology

## 2015-11-11 DIAGNOSIS — G35 Multiple sclerosis: Secondary | ICD-10-CM

## 2015-11-11 DIAGNOSIS — R208 Other disturbances of skin sensation: Secondary | ICD-10-CM | POA: Diagnosis not present

## 2015-11-11 DIAGNOSIS — F329 Major depressive disorder, single episode, unspecified: Secondary | ICD-10-CM | POA: Diagnosis not present

## 2015-11-11 DIAGNOSIS — R5383 Other fatigue: Secondary | ICD-10-CM | POA: Diagnosis not present

## 2015-11-11 DIAGNOSIS — R269 Unspecified abnormalities of gait and mobility: Secondary | ICD-10-CM

## 2015-11-11 DIAGNOSIS — R4189 Other symptoms and signs involving cognitive functions and awareness: Secondary | ICD-10-CM

## 2015-11-11 DIAGNOSIS — Z79899 Other long term (current) drug therapy: Secondary | ICD-10-CM

## 2015-11-11 DIAGNOSIS — R350 Frequency of micturition: Secondary | ICD-10-CM

## 2015-11-11 DIAGNOSIS — F32A Depression, unspecified: Secondary | ICD-10-CM

## 2015-11-11 MED ORDER — GILENYA 0.5 MG PO CAPS: ORAL_CAPSULE | ORAL | Status: DC

## 2015-11-11 NOTE — Progress Notes (Signed)
[facility]  PATIENT: Denise Bridges DOB: 10-23-1956  REFERRING DOCTOR OR PCP:  Cammie Fulp SOURCE: Patient  _________________________________   HISTORICAL  CHIEF COMPLAINT:  Chief Complaint  Patient presents with  . Multiple Sclerosis    Sts. she tolerates Gilenya well.  She denies new or worsening sx./fim  . Cognitive Disabilities    Has had cognitive testing with Dr. Leonides Cave, sts. he told her results are consistent with MS, that she processes things slowly/fim     HISTORY OF PRESENT ILLNESS:  [name] is a 59 year old woman with multiple sclerosis.  She has been on Gilenya x 3 years   She tolerates Gilenya well and has not had any exacerbation.  Labwork showed lymphopenia and she wa splaced on qod dosing.    Last lymphocyte count was low at 0.3 and WBC was 2.4 (previous 3.7).   However, she has had progressive worsening of her gait over the past 10 years or so.      Gait/strength/sensation: She feels gait is about the same.    She does worse with longer distances.   The left leg is weaker than the right.  Her husband noted that she has more trouble walking from car to store and back when she is out.   She uses a shopping cart to help with her balance.    Sometimes the left leg gives out   There is clumsiness as well.  She denies any significant numbness in the legs. However, she does have numbness and tingling in the left arm. She has been on Ampyra for a couple years with benefit and she tolerates it well.     Bladder: She has urinary frequency and urgency. She has had some incontinence, often on her way to the bathroom. She has nocturia twice most nights.   She has not noted hesitancy or failure to empty but had UTI in June.    She takes Vesicare with benefit.  Vision: Currently, she notes mild visual changes but feels they are stable.   Prior to her diagnosis of MS, she had an episode of diplopia that eventually improved. She sees her eye doctor once a  year.  Fatigue/sleep: She has physical and cognitive fatigue, worse later in the day and with heat.    She has more difficulty walking longer distances because of fatigue. She has difficulty falling asleep and wakes up a couple times most nights. Nocturia can wake her up several times a night.  She has restless leg syndrome. Gabapentin and baclofen have just helped a little bit.  This keeps her up some nights.  Mood/cognition: She is on venlafaxine and feels her mood is stable.    She also once was on zoloft and is uncertain which helped more.    When she tried stopping Venlafaxine, she felt depression was much worse.. She makes many errors at work which is often frustrating to her.  notes specifically that she has difficulty coming up with the right words and that she often transposes numbers.  There is some apathy. Processing speed is also slower. She notes some difficulty with short-term memory as well.   MS History:     She presented with facial weakness in the early 1990s. BrainMRI was consistent with MS.  cervical spine MRI in 2003 also showed multiple plaques within the cervical spine. She was started, initially, on Avonex. About 4 years ago, she switch to Gilenya. She tolerates Gilenya well and has not had any  exacerbation since before 2012. However, she has had progressive worsening of her gait over the past 10 years or so.       REVIEW OF SYSTEMS: Constitutional: No fevers, chills, sweats, or change in appetite.  She notes fatigue Eyes: No visual changes, double vision, eye pain Ear, nose and throat: No hearing loss, ear pain, nasal congestion, sore throat Cardiovascular: No chest pain, palpitations Respiratory: No shortness of breath at rest or with exertion.   No wheezes GastrointestinaI: No nausea, vomiting, diarrhea, abdominal pain, fecal incontinence Genitourinary: As above. Musculoskeletal: No neck pain, back pain Integumentary: No rash, pruritus, skin lesions Neurological: as  above Psychiatric: Notes mild depression at this time.  No anxiety Endocrine: No palpitations, diaphoresis, change in appetite, change in weigh or increased thirst Hematologic/Lymphatic: No anemia, purpura, petechiae. Allergic/Immunologic: No itchy/runny eyes, nasal congestion, recent allergic reactions, rashes  ALLERGIES: Allergies  Allergen Reactions  . Losartan Cough    HOME MEDICATIONS:  Current outpatient prescriptions:  .  AMPYRA 10 MG TB12, Take 1 tablet (10 mg total) by mouth 2 (two) times daily., Disp: 180 tablet, Rfl: 3 .  atenolol (TENORMIN) 100 MG tablet, Take 100 mg by mouth daily., Disp: , Rfl:  .  baclofen (LIORESAL) 10 MG tablet, Take 1 tablet (10 mg total) by mouth 3 (three) times daily. (Patient taking differently: Take 10 mg by mouth 3 (three) times daily as needed. ), Disp: 90 each, Rfl: 5 .  cholecalciferol (VITAMIN D) 1000 UNITS tablet, Take 1,000 Units by mouth daily., Disp: , Rfl:  .  gabapentin (NEURONTIN) 300 MG capsule, One po in am and two po at night, Disp: 90 capsule, Rfl: 3 .  GILENYA 0.5 MG CAPS, One pill every other day, Disp: 45 capsule, Rfl: 3 .  solifenacin (VESICARE) 10 MG tablet, Take 1 tablet (10 mg total) by mouth daily., Disp: 90 tablet, Rfl: 3 .  venlafaxine (EFFEXOR) 75 MG tablet, Take 75 mg by mouth 2 (two) times daily., Disp: , Rfl:  .  amLODipine (NORVASC) 5 MG tablet, , Disp: , Rfl:  .  ciprofloxacin (CIPRO) 250 MG tablet, take 1 tablet by mouth twice a day for 5 days, Disp: , Rfl: 0  PAST MEDICAL HISTORY: Past Medical History  Diagnosis Date  . Multiple sclerosis (HCC)   . Movement disorder   . Hypertension   . Headache   . Neuropathy (HCC)   . Vision abnormalities     PAST SURGICAL HISTORY: History reviewed. No pertinent past surgical history.  FAMILY HISTORY: Family History  Problem Relation Age of Onset  . Pancreatic cancer Mother   . Diabetes Mother   . Prostate cancer Father   . Dementia Father   . Multiple sclerosis  Cousin     SOCIAL HISTORY:  Social History   Social History  . Marital Status: Married    Spouse Name: N/A  . Number of Children: N/A  . Years of Education: N/A   Occupational History  . Not on file.   Social History Main Topics  . Smoking status: Never Smoker   . Smokeless tobacco: Not on file  . Alcohol Use: No  . Drug Use: No  . Sexual Activity: Not on file   Other Topics Concern  . Not on file   Social History Narrative     PHYSICAL EXAM  There were no vitals filed for this visit.  There is no weight on file to calculate BMI.   General: The patient is well-developed and well-nourished and  in no acute distress   Neurologic Exam  Mental status: The patient is alert and oriented x 3 at the time of the examination. The patient has apparent normal recent and remote memory, with a reduced attention span and concentration ability.   Speech is normal.  Cranial nerves: Extraocular movements are full.  Facial symmetry is present. There is good facial sensation to soft touch bilaterally.Facial strength is normal.  Trapezius and sternocleidomastoid strength is normal. No dysarthria is noted.    No obvious hearing deficits are noted.  Motor:  Muscle bulk is normal.   Tone is increased in both legs, more on the left. Strength is  5 / 5 in all 4 extremities.   Sensory: Sensory testing shows decreased touch and vibration sensation in left arm and leg. Symmetric temperature in arms.    Coordination: Cerebellar testing reveals good right and reduced left finger-nose-finger and reduced heel-to-shin, worse on left.  Gait and station: Station is normal.   Gait is mildly wide and spastic, left > right. Her turn was off balance. She cannot do a good tandem walk..    Reflexes: Deep tendon reflexes are increased in legs, left more than right. There was nonsustained clonus at the left ankle and spread from the left knee. Reflexes in the arms were more symmetric.        DIAGNOSTIC  DATA (LABS, IMAGING, TESTING) - I reviewed patient records, labs, notes, testing and imaging myself where available.    ASSESSMENT AND PLAN  Multiple sclerosis (HCC)  Gait disorder  Dysesthesia  Other fatigue  Urinary frequency  Depression  High risk medication use  Cognitive impairment  1.  Continue Gilenya. She has had reduced white blood cell count and lymphopenia and is currently on every other day dosing. We will recheck CBC with differential and hepatic function tests to check for severe lymphopenia and hepatotoxicity. 2.   At about the time of the next visit, we will check an MRI of the brain to make sure that there has not been subclinical progression. If present, we would need to consider a different DMT.   3.   Handicap parking placard rtc 6 months or sooner if problems.    Richard A. Epimenio Foot, MD, PhD 11/11/2015, 3:47 PM Certified in Neurology, Clinical Neurophysiology, Sleep Medicine, Pain Medicine and Neuroimaging  [facility] [address] [place], [place] [address] [phone]

## 2015-11-12 ENCOUNTER — Telehealth: Payer: Self-pay | Admitting: *Deleted

## 2015-11-12 LAB — CBC WITH DIFFERENTIAL/PLATELET
BASOS: 0 %
Basophils Absolute: 0 10*3/uL (ref 0.0–0.2)
EOS (ABSOLUTE): 0.1 10*3/uL (ref 0.0–0.4)
EOS: 2 %
HEMATOCRIT: 37.7 % (ref 34.0–46.6)
Hemoglobin: 12 g/dL (ref 11.1–15.9)
Immature Grans (Abs): 0 10*3/uL (ref 0.0–0.1)
Immature Granulocytes: 0 %
LYMPHS ABS: 0.2 10*3/uL — AB (ref 0.7–3.1)
LYMPHS: 7 %
MCH: 29.6 pg (ref 26.6–33.0)
MCHC: 31.8 g/dL (ref 31.5–35.7)
MCV: 93 fL (ref 79–97)
MONOS ABS: 0.6 10*3/uL (ref 0.1–0.9)
Monocytes: 17 %
NEUTROS ABS: 2.6 10*3/uL (ref 1.4–7.0)
Neutrophils: 74 %
Platelets: 210 10*3/uL (ref 150–379)
RBC: 4.06 x10E6/uL (ref 3.77–5.28)
RDW: 13.8 % (ref 12.3–15.4)
WBC: 3.5 10*3/uL (ref 3.4–10.8)

## 2015-11-12 LAB — HEPATIC FUNCTION PANEL
ALT: 35 IU/L — AB (ref 0–32)
AST: 26 IU/L (ref 0–40)
Albumin: 4.3 g/dL (ref 3.6–4.8)
Alkaline Phosphatase: 75 IU/L (ref 39–117)
BILIRUBIN TOTAL: 0.3 mg/dL (ref 0.0–1.2)
Bilirubin, Direct: 0.1 mg/dL (ref 0.00–0.40)
Total Protein: 6.7 g/dL (ref 6.0–8.5)

## 2015-11-12 NOTE — Telephone Encounter (Signed)
LMTC./fim 

## 2015-11-12 NOTE — Telephone Encounter (Signed)
-----   Message from Richard A Sater, MD sent at 11/12/2015 12:22 PM EST ----- The overall white blood cell count is better than last time. The lymphocyte count is low but that is expected with Gilenya.   Liver tests are fine. 

## 2015-11-25 ENCOUNTER — Telehealth: Payer: Self-pay | Admitting: *Deleted

## 2015-11-25 NOTE — Telephone Encounter (Signed)
LMTC./fim 

## 2015-11-25 NOTE — Telephone Encounter (Signed)
-----   Message from Richard A Sater, MD sent at 11/12/2015 12:22 PM EST ----- The overall white blood cell count is better than last time. The lymphocyte count is low but that is expected with Gilenya.   Liver tests are fine. 

## 2015-11-30 NOTE — Telephone Encounter (Signed)
Patient returned Faith's call °

## 2015-11-30 NOTE — Telephone Encounter (Signed)
Spoke to pt and advised her that Dr. Epimenio Foot received the results from her blood work and he wanted to tell the pt that overall, her white blood cell count is better than last time but her lymphocyte count is low but that is to be expected with Gilenya. Her liver tests were fine. Pt verbalized understanding and has no questions at this time.

## 2015-12-08 ENCOUNTER — Telehealth: Payer: Self-pay | Admitting: Neurology

## 2015-12-08 MED ORDER — AMPYRA 10 MG PO TB12: 10.0000 mg | ORAL_TABLET | Freq: Two times a day (BID) | ORAL | Status: DC

## 2015-12-08 NOTE — Telephone Encounter (Signed)
We called the Pharmacy at the number provided.  Spoke with [facility] who says they do show the request in their system, but it has not been processed yet.  He then transferred me to [place].  She verified Rx and states they are working on processing it at this time. Contact info for Ampyra Assistance is [phone].  I called back and spoke with Mr Pettinato who said he was able to locate the number.  He is aware Pharmacy has Rx and is currently processing the order.

## 2015-12-08 NOTE — Telephone Encounter (Signed)
Pt's husband called requesting refill for AMPYRA 10 MG TB12 to be sent to [facility] in [place], [place]. Fax # [phone], provider phone # [phone]. Please call when RX has been sent. Pt is also on co-payment assistance program but her husband does not have phone # to call. If you have the number please give it to him also.

## 2015-12-10 ENCOUNTER — Telehealth: Payer: Self-pay | Admitting: *Deleted

## 2015-12-10 NOTE — Telephone Encounter (Signed)
-----   Message from Asa Lente, MD sent at 11/12/2015 12:22 PM EST ----- The overall white blood cell count is better than last time. The lymphocyte count is low but that is expected with Gilenya.   Liver tests are fine.

## 2015-12-10 NOTE — Telephone Encounter (Signed)
LMTC./fim 

## 2015-12-23 NOTE — Telephone Encounter (Signed)
I have spoken with Denise Bridges this morning and per RAS, advised that overall wbc is better than last time--lymphocytes are low, as expected on Gilenya.  Not low enough to cause concern.  LFT's are fine.  She verbalized understanding of same/fim

## 2015-12-23 NOTE — Telephone Encounter (Signed)
-----   Message from Richard A Sater, MD sent at 11/12/2015 12:22 PM EST ----- The overall white blood cell count is better than last time. The lymphocyte count is low but that is expected with Gilenya.   Liver tests are fine. 

## 2016-04-06 ENCOUNTER — Encounter: Payer: Self-pay | Admitting: *Deleted

## 2016-04-10 ENCOUNTER — Encounter: Payer: Self-pay | Admitting: *Deleted

## 2016-05-11 ENCOUNTER — Telehealth: Payer: Self-pay | Admitting: *Deleted

## 2016-05-11 ENCOUNTER — Encounter: Payer: Self-pay | Admitting: Neurology

## 2016-05-11 ENCOUNTER — Ambulatory Visit (INDEPENDENT_AMBULATORY_CARE_PROVIDER_SITE_OTHER): Payer: BC Managed Care – PPO | Admitting: Neurology

## 2016-05-11 VITALS — BP 148/92 | HR 74 | Resp 14 | Ht 65.0 in | Wt 154.0 lb

## 2016-05-11 DIAGNOSIS — R269 Unspecified abnormalities of gait and mobility: Secondary | ICD-10-CM

## 2016-05-11 DIAGNOSIS — R208 Other disturbances of skin sensation: Secondary | ICD-10-CM

## 2016-05-11 DIAGNOSIS — G35 Multiple sclerosis: Secondary | ICD-10-CM

## 2016-05-11 DIAGNOSIS — R4189 Other symptoms and signs involving cognitive functions and awareness: Secondary | ICD-10-CM | POA: Diagnosis not present

## 2016-05-11 DIAGNOSIS — F329 Major depressive disorder, single episode, unspecified: Secondary | ICD-10-CM | POA: Diagnosis not present

## 2016-05-11 DIAGNOSIS — R5383 Other fatigue: Secondary | ICD-10-CM | POA: Diagnosis not present

## 2016-05-11 DIAGNOSIS — Z79899 Other long term (current) drug therapy: Secondary | ICD-10-CM | POA: Diagnosis not present

## 2016-05-11 DIAGNOSIS — R350 Frequency of micturition: Secondary | ICD-10-CM | POA: Diagnosis not present

## 2016-05-11 DIAGNOSIS — F32A Depression, unspecified: Secondary | ICD-10-CM

## 2016-05-11 DIAGNOSIS — M21372 Foot drop, left foot: Secondary | ICD-10-CM | POA: Diagnosis not present

## 2016-05-11 MED ORDER — GILENYA 0.5 MG PO CAPS: ORAL_CAPSULE | ORAL | Status: DC

## 2016-05-11 MED ORDER — SOLIFENACIN SUCCINATE 10 MG PO TABS: 10.0000 mg | ORAL_TABLET | Freq: Every day | ORAL | Status: AC

## 2016-05-11 MED ORDER — AMPYRA 10 MG PO TB12: 10.0000 mg | ORAL_TABLET | Freq: Two times a day (BID) | ORAL | Status: AC

## 2016-05-11 MED ORDER — BACLOFEN 10 MG PO TABS: 10.0000 mg | ORAL_TABLET | Freq: Three times a day (TID) | ORAL | Status: AC | PRN

## 2016-05-11 MED ORDER — GABAPENTIN 300 MG PO CAPS: ORAL_CAPSULE | ORAL | Status: AC

## 2016-05-11 NOTE — Telephone Encounter (Signed)
Order for left AFO faxed to [facility] fax # [phone]/fim

## 2016-05-11 NOTE — Progress Notes (Signed)
[facility]  PATIENT: Denise Bridges DOB: 08/18/1956  REFERRING DOCTOR OR PCP:  Cammie Fulp SOURCE: Patient  _________________________________   HISTORICAL  CHIEF COMPLAINT:  Chief Complaint  Patient presents with  . Multiple Sclerosis    Sts. she continues to tolerate Gilenya well.  Husband sts. her walking is worse--sts. she drags her left leg more.  Fatigue some worse/fim    HISTORY OF PRESENT ILLNESS:  Denise Bridges is a 60 year old woman with multiple sclerosis.  She has been on Gilenya since 2013 years   She tolerates Gilenya well and has not had any exacerbation.  Labwork showed severe lymphopenia and she wa splaced on qod dosing.      Last lymphocytes were 0.2 but WBC ok at 3.5.   Since last visit, she notes slow worsening of gait and left foot.     Gait/strength/sensation: She feels gait is worse and she relies on her cane more.   She drags her left foot some and she stumbles frequently and has fallen a couple times this year.  She does worse with longer distances.    Sometimes the left leg gives out   There is clumsiness as well.  She denies any significant numbness in the legs. However, she does have numbness and tingling in the left arm. She has been on Ampyra for a couple years with benefit and she tolerates it well.     Bladder: She has urinary frequency and urgency. She has no recent incontinence.   She has nocturia x 1 most nights.  She tries to drink less. She has not noted hesitancy or failure to empty but had UTI in June.    She takes Vesicare with benefit.  Vision: Currently, she notes mild stable visual changes. She wears glasses and notes a new prescription.     Prior to her diagnosis of MS, she had an episode of diplopia that eventually improved. She sees her eye doctor once a year.  Fatigue/sleep: She has physical and cognitive fatigue, worse later in the day and with heat.   She nots she tires out easily if she is up and standing.    She has mild  difficulty falling asleep but wakes up a couple times most nights, sometimes having difficulty falling back asleep.  . Nocturia can wake her up several times a night.  She has restless leg syndrome. Gabapentin and baclofen have just helped a little bit.  This keeps her up some nights.  Mood/cognition: She changes to Cymbalta 30 mg po bid from venlafaxine and feels mood is the same - mild depression.    She recently retired but was having some cognitive issues while at work.  She has difficulty coming up with the right words and that she often transposes numbers.  There is some apathy. Processing speed is also slower. She notes some difficulty with short-term memory as well.   MS History:     She presented with facial weakness in the early 1990s. BrainMRI was consistent with MS.  cervical spine MRI in 2003 also showed multiple plaques within the cervical spine. She was started, initially, on Avonex. About 4 years ago, she switch to Gilenya. She tolerates Gilenya well and has not had any exacerbation since before 2012. However, she has had progressive worsening of her gait over the past 10 years or so.       REVIEW OF SYSTEMS: Constitutional: No fevers, chills, sweats, or change in appetite.  She notes fatigue Eyes:  No visual changes, double vision, eye pain Ear, nose and throat: No hearing loss, ear pain, nasal congestion, sore throat Cardiovascular: No chest pain, palpitations Respiratory: No shortness of breath at rest or with exertion.   No wheezes GastrointestinaI: No nausea, vomiting, diarrhea, abdominal pain, fecal incontinence Genitourinary: As above. Musculoskeletal: No neck pain, back pain Integumentary: No rash, pruritus, skin lesions Neurological: as above Psychiatric: Notes mild depression at this time.  No anxiety Endocrine: No palpitations, diaphoresis, change in appetite, change in weigh or increased thirst Hematologic/Lymphatic: No anemia, purpura,  petechiae. Allergic/Immunologic: No itchy/runny eyes, nasal congestion, recent allergic reactions, rashes  ALLERGIES: Allergies  Allergen Reactions  . Losartan Cough    HOME MEDICATIONS:  Current outpatient prescriptions:  .  AMPYRA 10 MG TB12, Take 1 tablet (10 mg total) by mouth 2 (two) times daily., Disp: 180 tablet, Rfl: 3 .  atenolol (TENORMIN) 100 MG tablet, Take 100 mg by mouth daily., Disp: , Rfl:  .  baclofen (LIORESAL) 10 MG tablet, Take 1 tablet (10 mg total) by mouth 3 (three) times daily. (Patient taking differently: Take 10 mg by mouth 3 (three) times daily as needed. ), Disp: 90 each, Rfl: 5 .  cetirizine (ZYRTEC) 10 MG tablet, Take 10 mg by mouth daily., Disp: , Rfl:  .  cholecalciferol (VITAMIN D) 1000 UNITS tablet, Take 1,000 Units by mouth daily., Disp: , Rfl:  .  ciprofloxacin (CIPRO) 250 MG tablet, take 1 tablet by mouth twice a day for 5 days, Disp: , Rfl: 0 .  DULoxetine (CYMBALTA) 30 MG capsule, , Disp: , Rfl: 0 .  gabapentin (NEURONTIN) 300 MG capsule, One po in am and two po at night, Disp: 90 capsule, Rfl: 3 .  GILENYA 0.5 MG CAPS, One pill every other day, Disp: 45 capsule, Rfl: 3 .  naproxen sodium (ANAPROX) 220 MG tablet, Take 220 mg by mouth as needed., Disp: , Rfl:  .  omeprazole (PRILOSEC) 20 MG capsule, Take 20 mg by mouth daily., Disp: , Rfl:  .  solifenacin (VESICARE) 10 MG tablet, Take 1 tablet (10 mg total) by mouth daily., Disp: 90 tablet, Rfl: 3  PAST MEDICAL HISTORY: Past Medical History  Diagnosis Date  . Multiple sclerosis (HCC)   . Movement disorder   . Hypertension   . Headache   . Neuropathy (HCC)   . Vision abnormalities     PAST SURGICAL HISTORY: History reviewed. No pertinent past surgical history.  FAMILY HISTORY: Family History  Problem Relation Age of Onset  . Pancreatic cancer Mother   . Diabetes Mother   . Prostate cancer Father   . Dementia Father   . Multiple sclerosis Cousin     SOCIAL HISTORY:  Social History    Social History  . Marital Status: Married    Spouse Name: N/A  . Number of Children: N/A  . Years of Education: N/A   Occupational History  . Not on file.   Social History Main Topics  . Smoking status: Never Smoker   . Smokeless tobacco: Not on file  . Alcohol Use: No  . Drug Use: No  . Sexual Activity: Not on file   Other Topics Concern  . Not on file   Social History Narrative     PHYSICAL EXAM  Filed Vitals:   05/11/16 1510  BP: 148/92  Pulse: 74  Resp: 14  Height: 5\' 5"  (1.651 m)  Weight: 154 lb (69.854 kg)    Body mass index is 25.63 kg/(m^2).  General: The patient is well-developed and well-nourished and in no acute distress   Neurologic Exam  Mental status: The patient is alert and oriented x 3 at the time of the examination. The patient has apparent normal recent and remote memory, with a reduced attention span and concentration ability.   Speech is normal.  Cranial nerves: Extraocular movements are full.  Facial symmetry is present. There is good facial sensation to soft touch bilaterally.Facial strength is normal.  Trapezius and sternocleidomastoid strength is normal. No dysarthria is noted.    No obvious hearing deficits are noted.  Motor:  Muscle bulk is normal.   Tone is increased in both legs, more on the left. Strength is  5 / 5 in arms and right leg but 4/5 proximally and 4-/5 ankle extension on the left.   Sensory: Sensory testing shows decreased touch and vibration sensation in left arm and leg. Symmetric temperature in arms.    Coordination: Cerebellar testing reveals good right and reduced left finger-nose-finger and reduced heel-to-shin, worse on left.  Gait and station: Station is normal.   Gait is mildly wide and spastic, left > right. She has left foot drop.   Her turn was off balance. She cannot do a good tandem walk..    Reflexes: Deep tendon reflexes are increased in legs, left more than right. There was nonsustained clonus at the  left ankle and spread from the left knee. Reflexes in the arms were more symmetric.        DIAGNOSTIC DATA (LABS, IMAGING, TESTING) - I reviewed patient records, labs, notes, testing and imaging myself where available.    ASSESSMENT AND PLAN  Multiple sclerosis (HCC)  Gait disorder  Dysesthesia  High risk medication use  Other fatigue  Urinary frequency  Depression  Cognitive impairment  Foot drop, left   1.  Continue Gilenya. She has had reduced white blood cell count and lymphopenia and is currently on every other day dosing. We will recheck CBC with differential to check for severe lymphopenia and hepatotoxicity. 2.   Check an MRI of the brain and cerv spine to make sure that there has not been subclinical progression. If present, we would need to consider a different DMT.   3.   Left AFO for gait.   rtc 5 months or sooner if problems.    45 minute face to face with > 1/2 the time counseling and coordinating care about MS and symptoms.       Richard A. Epimenio Foot, MD, PhD 05/11/2016, 3:15 PM Certified in Neurology, Clinical Neurophysiology, Sleep Medicine, Pain Medicine and Neuroimaging  [facility] [address] [place], [place] [address] [phone]

## 2016-05-12 LAB — COMPREHENSIVE METABOLIC PANEL
A/G RATIO: 1.6 (ref 1.2–2.2)
ALBUMIN: 4.1 g/dL (ref 3.6–4.8)
ALK PHOS: 70 IU/L (ref 39–117)
ALT: 17 IU/L (ref 0–32)
AST: 22 IU/L (ref 0–40)
BUN / CREAT RATIO: 23 (ref 12–28)
BUN: 23 mg/dL (ref 8–27)
Bilirubin Total: 0.2 mg/dL (ref 0.0–1.2)
CALCIUM: 9.9 mg/dL (ref 8.7–10.3)
CO2: 28 mmol/L (ref 18–29)
Chloride: 104 mmol/L (ref 96–106)
Creatinine, Ser: 0.98 mg/dL (ref 0.57–1.00)
GFR calc Af Amer: 72 mL/min/{1.73_m2} (ref >59)
GFR, EST NON AFRICAN AMERICAN: 62 mL/min/{1.73_m2} (ref >59)
GLOBULIN, TOTAL: 2.6 g/dL (ref 1.5–4.5)
Glucose: 85 mg/dL (ref 65–99)
POTASSIUM: 5 mmol/L (ref 3.5–5.2)
SODIUM: 146 mmol/L — AB (ref 134–144)
Total Protein: 6.7 g/dL (ref 6.0–8.5)

## 2016-05-12 LAB — CBC WITH DIFFERENTIAL/PLATELET
BASOS ABS: 0 10*3/uL (ref 0.0–0.2)
BASOS: 0 %
EOS (ABSOLUTE): 0.1 10*3/uL (ref 0.0–0.4)
Eos: 3 %
HEMATOCRIT: 37.4 % (ref 34.0–46.6)
Hemoglobin: 12.2 g/dL (ref 11.1–15.9)
IMMATURE GRANS (ABS): 0 10*3/uL (ref 0.0–0.1)
Immature Granulocytes: 0 %
LYMPHS ABS: 0.3 10*3/uL — AB (ref 0.7–3.1)
LYMPHS: 7 %
MCH: 30.1 pg (ref 26.6–33.0)
MCHC: 32.6 g/dL (ref 31.5–35.7)
MCV: 92 fL (ref 79–97)
MONOCYTES: 20 %
Monocytes Absolute: 0.7 10*3/uL (ref 0.1–0.9)
NEUTROS ABS: 2.3 10*3/uL (ref 1.4–7.0)
Neutrophils: 70 %
Platelets: 199 10*3/uL (ref 150–379)
RBC: 4.05 x10E6/uL (ref 3.77–5.28)
RDW: 13.8 % (ref 12.3–15.4)
WBC: 3.4 10*3/uL (ref 3.4–10.8)

## 2016-05-12 NOTE — Telephone Encounter (Signed)
Received fax confirmation on gilenya.

## 2016-05-15 ENCOUNTER — Telehealth: Payer: Self-pay | Admitting: *Deleted

## 2016-05-15 NOTE — Telephone Encounter (Signed)
-----   Message from Asa Lente, MD sent at 05/12/2016 10:55 AM EDT ----- The labs look okay. The lymphocytes are low but we see that frequently with Gilenya.   The overall white blood cell count was better than last year.

## 2016-05-15 NOTE — Telephone Encounter (Signed)
I have spoken with Denise Bridges this afternoon and per RAS, advised that labs done in our office look ok--lymphocytes are low, but that is b/c she is on Gilenya.  Overall her wbc ct. is better than it was last yr.  She verbalized understanding of same/fim

## 2016-05-22 ENCOUNTER — Other Ambulatory Visit (HOSPITAL_COMMUNITY): Payer: Self-pay | Admitting: Family Medicine

## 2016-05-22 DIAGNOSIS — Z1231 Encounter for screening mammogram for malignant neoplasm of breast: Secondary | ICD-10-CM

## 2016-05-29 ENCOUNTER — Ambulatory Visit
Admission: RE | Admit: 2016-05-29 | Discharge: 2016-05-29 | Disposition: A | Discharge status: Home / Self Care | Payer: BC Managed Care – PPO | Source: Ambulatory Visit | Attending: Family Medicine | Admitting: Family Medicine

## 2016-05-29 DIAGNOSIS — Z1231 Encounter for screening mammogram for malignant neoplasm of breast: Secondary | ICD-10-CM

## 2016-06-08 ENCOUNTER — Ambulatory Visit
Admission: RE | Admit: 2016-06-08 | Discharge: 2016-06-08 | Disposition: A | Discharge status: Home / Self Care | Payer: BC Managed Care – PPO | Source: Ambulatory Visit | Attending: Neurology | Admitting: Neurology

## 2016-06-08 DIAGNOSIS — G35 Multiple sclerosis: Secondary | ICD-10-CM | POA: Diagnosis not present

## 2016-06-08 DIAGNOSIS — R269 Unspecified abnormalities of gait and mobility: Secondary | ICD-10-CM

## 2016-06-08 MED ORDER — GADOBENATE DIMEGLUMINE 529 MG/ML IV SOLN
14.0000 mL | Freq: Once | INTRAVENOUS | Status: AC | PRN
  Administered 2016-06-08: 14 mL via INTRAVENOUS

## 2016-06-12 ENCOUNTER — Telehealth: Payer: Self-pay | Admitting: *Deleted

## 2016-06-12 NOTE — Telephone Encounter (Signed)
LMTC./fim 

## 2016-06-12 NOTE — Telephone Encounter (Signed)
-----   Message from Richard A Sater, MD sent at 06/11/2016  3:07 PM EDT ----- Please let her know that the MRI of the brain and spinal cord show old MS foci. When compared to the 2013 MRI, there are no new lesions. 

## 2016-06-23 ENCOUNTER — Telehealth: Payer: Self-pay | Admitting: *Deleted

## 2016-06-23 NOTE — Telephone Encounter (Signed)
-----   Message from Asa Lente, MD sent at 06/11/2016  3:07 PM EDT ----- Please let her know that the MRI of the brain and spinal cord show old MS foci. When compared to the 2013 MRI, there are no new lesions.

## 2016-06-23 NOTE — Telephone Encounter (Signed)
I have spoken with Corrie Dandy and per RAS, adivsed MRI showed no new MS lesions, when compared with 2013 MRI.  She verbalized understanding of same/fim

## 2016-09-04 ENCOUNTER — Telehealth: Payer: Self-pay | Admitting: Neurology

## 2016-09-04 ENCOUNTER — Telehealth: Payer: Self-pay | Admitting: *Deleted

## 2016-09-04 NOTE — Telephone Encounter (Signed)
Danielle/[facility] Prior Auth Dept [phone] called regarding AMPYRA, received PA but several questions weren't answered. Please call.

## 2016-09-04 NOTE — Telephone Encounter (Signed)
Gilenya PA completed and faxed to [facility] fax# [phone]/fim 

## 2016-09-04 NOTE — Telephone Encounter (Signed)
I have spoken with [facility].  I misread the PA request I received earlier today--it was a request to complete a PA for Ampyra, not Gilenya.  I have corrected this and refaxed to [facility]/fim

## 2016-09-05 ENCOUNTER — Encounter: Payer: Self-pay | Admitting: *Deleted

## 2016-09-22 ENCOUNTER — Other Ambulatory Visit: Payer: Self-pay | Admitting: Neurology

## 2016-09-26 NOTE — Telephone Encounter (Signed)
Fax received from [facility], phone# [phone].  Gilenya PA approved.  Plan Member ID:  [id number].  Effective dates: 09-25-16 thru 09-25-17./fim

## 2016-09-27 ENCOUNTER — Other Ambulatory Visit: Payer: Self-pay | Admitting: Neurology

## 2016-09-28 NOTE — Telephone Encounter (Signed)
I have spoken with Mr. Melvyn NethLewis and explained that Gilenya PA was approved.  He should be able to order this from [facility] now/fim

## 2016-09-28 NOTE — Telephone Encounter (Signed)
Pt's husband called in asking if the rx for [facility] . Please all and advise

## 2016-10-11 ENCOUNTER — Ambulatory Visit: Payer: BC Managed Care – PPO | Admitting: Neurology

## 2016-11-13 ENCOUNTER — Telehealth: Payer: Self-pay | Admitting: *Deleted

## 2016-11-13 NOTE — Telephone Encounter (Signed)
RAS referred pt. to the [facility] for tx. of foot drop.  I have faxed demo sheet to Lauren at below fax#/fim

## 2016-11-13 NOTE — Telephone Encounter (Signed)
Denise Bridges with [facility] is requesting a demographic sheet address and phone number fax number is [phone] phone [phone]

## 2016-11-15 ENCOUNTER — Encounter: Payer: Self-pay | Admitting: Neurology

## 2016-11-15 ENCOUNTER — Other Ambulatory Visit: Payer: Self-pay | Admitting: *Deleted

## 2016-11-15 DIAGNOSIS — G35 Multiple sclerosis: Secondary | ICD-10-CM

## 2016-11-21 ENCOUNTER — Ambulatory Visit: Payer: BC Managed Care – PPO | Admitting: Neurology

## 2017-09-28 IMAGING — MG DIGITAL SCREENING BILATERAL MAMMOGRAM WITH CAD
3 series · 3 of 3 positions shown · non-contrast
Comparison: Previous exam(s).

CLINICAL DATA: Screening.

EXAM:
DIGITAL SCREENING BILATERAL MAMMOGRAM WITH CAD

[R MLO]
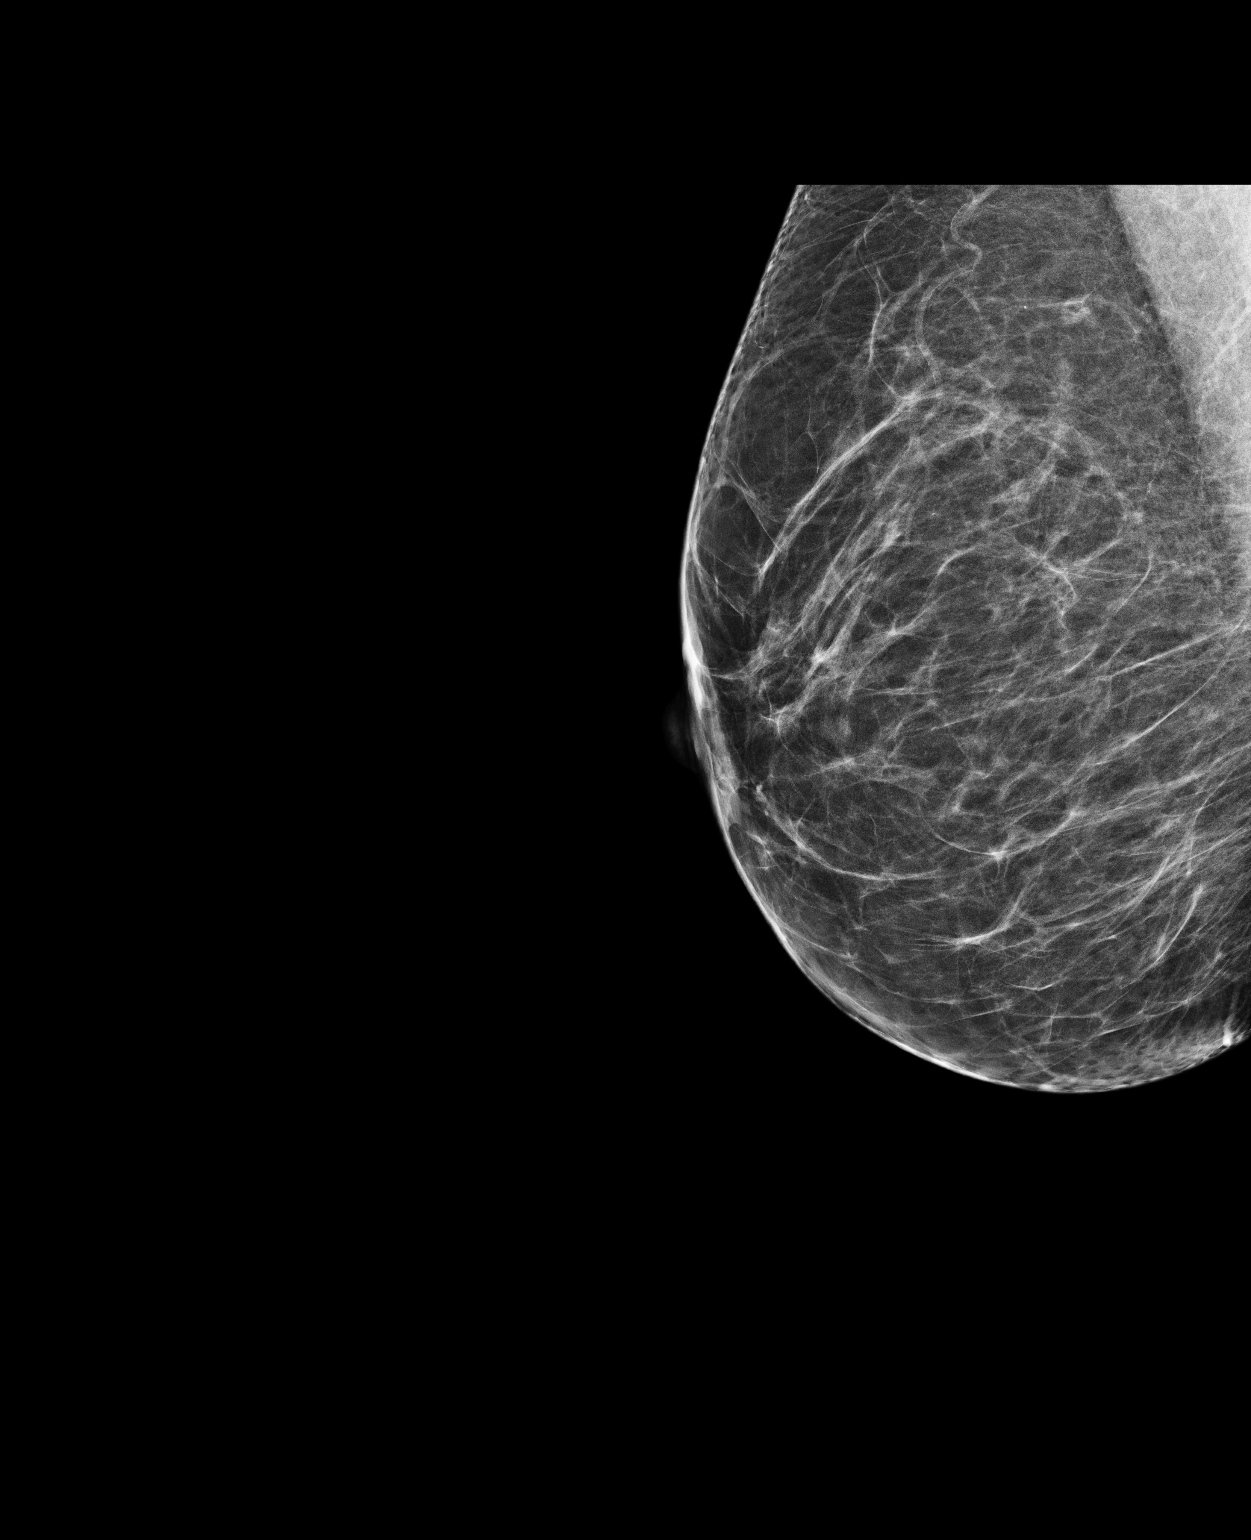

[L CC]
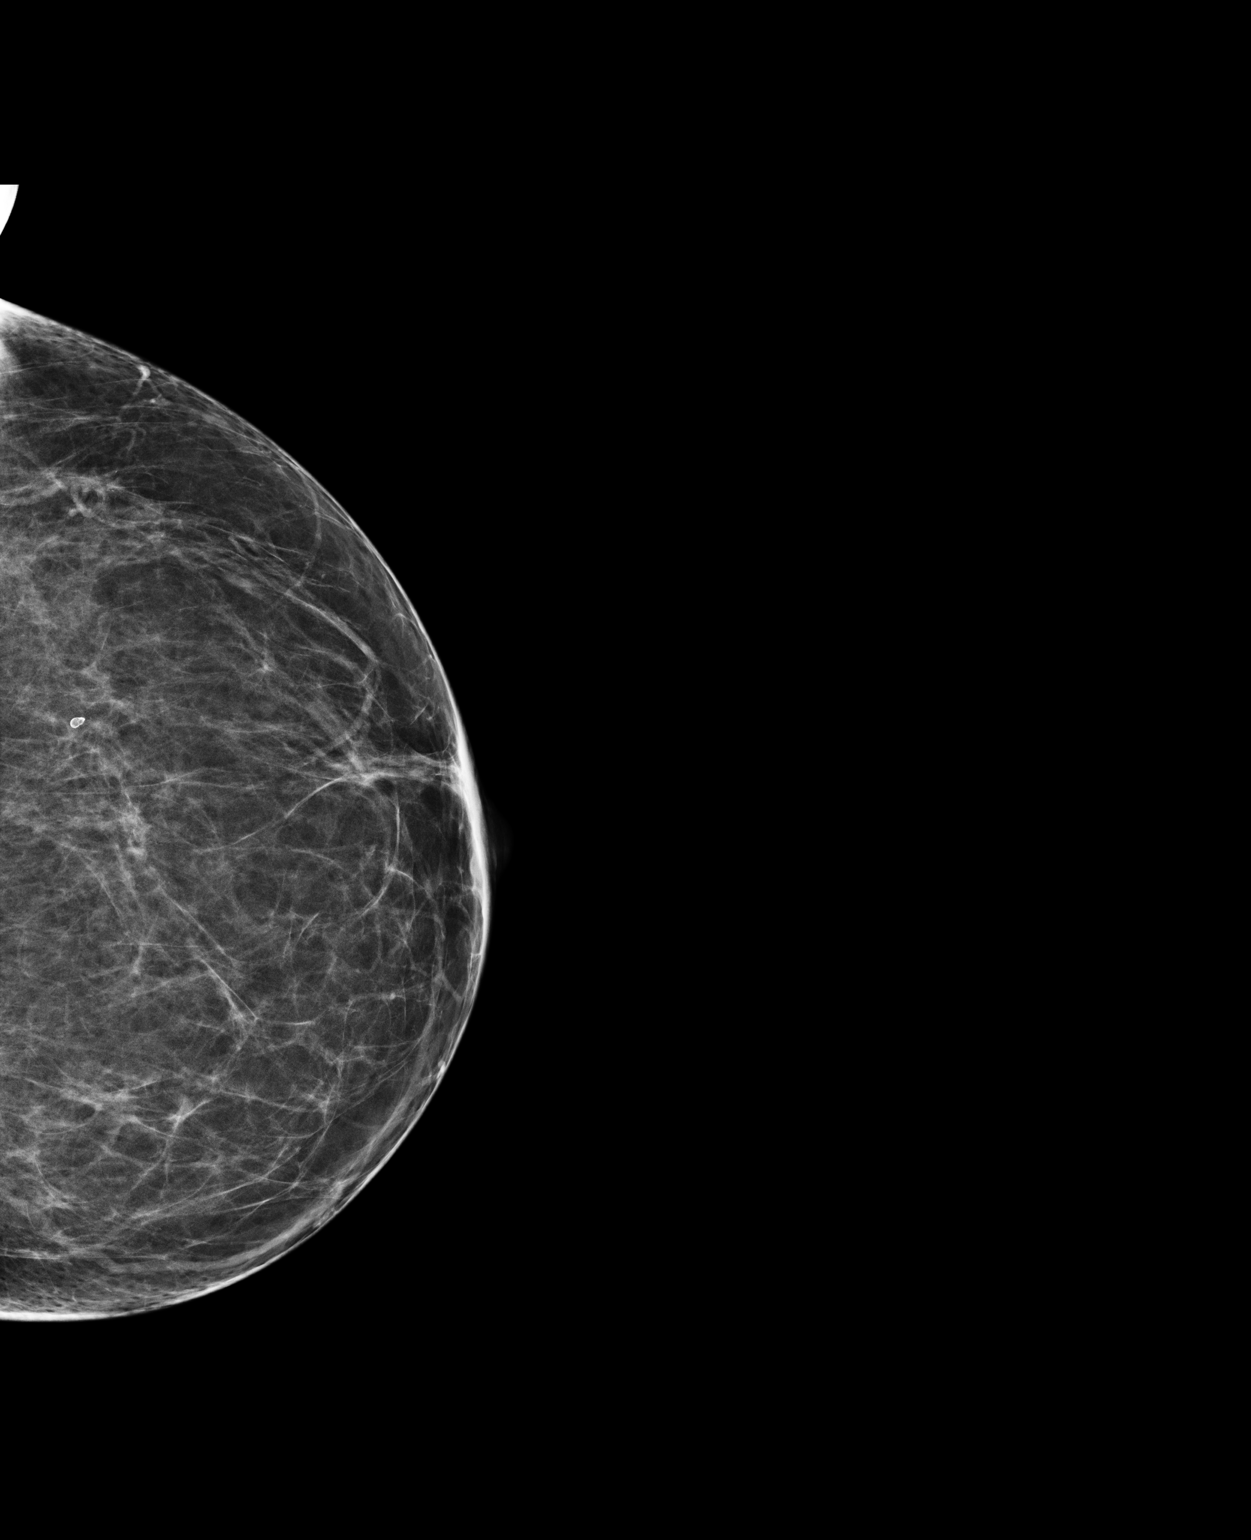

[R CC]
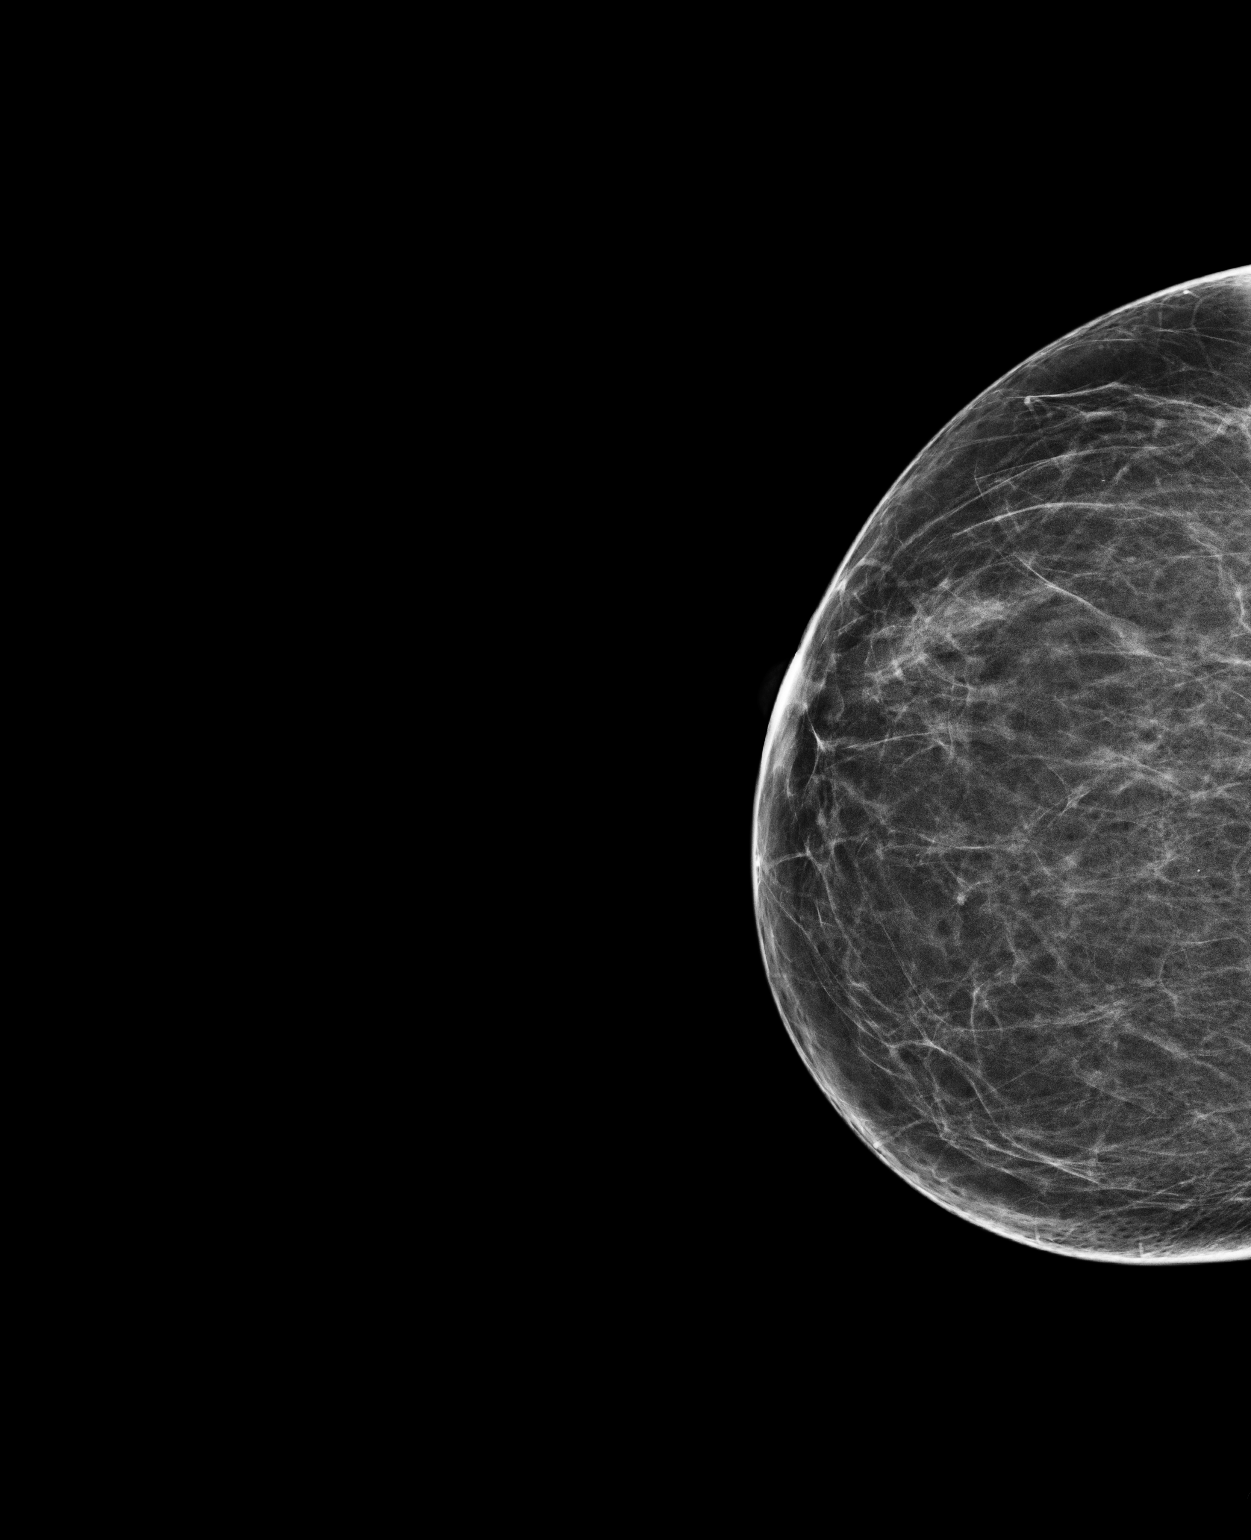

[3 of 3 positions shown; findings below may reference images not displayed]

ACR Breast Density Category b: There are scattered areas of
fibroglandular density.
FINDINGS: There are no findings suspicious for malignancy. Images were
processed with CAD.
IMPRESSION: No mammographic evidence of malignancy. A result letter of this
screening mammogram will be mailed directly to the patient.

RECOMMENDATION:
Screening mammogram in one year. (Code:AS-G-LCT)

BI-RADS CATEGORY  1: Negative.
